# Patient Record
Sex: Female | Born: 1940 | Race: White | Hispanic: No
Health system: Southern US, Community
[De-identification: ages and names within clinical notes are randomized; demographics above are authoritative.]

## PROBLEM LIST (undated history)

## (undated) DIAGNOSIS — IMO0001 Reserved for inherently not codable concepts without codable children: Secondary | ICD-10-CM

## (undated) DIAGNOSIS — F419 Anxiety disorder, unspecified: Secondary | ICD-10-CM

## (undated) DIAGNOSIS — E0590 Thyrotoxicosis, unspecified without thyrotoxic crisis or storm: Secondary | ICD-10-CM

## (undated) DIAGNOSIS — E785 Hyperlipidemia, unspecified: Secondary | ICD-10-CM

## (undated) DIAGNOSIS — R030 Elevated blood-pressure reading, without diagnosis of hypertension: Secondary | ICD-10-CM

## (undated) DIAGNOSIS — I10 Essential (primary) hypertension: Secondary | ICD-10-CM

## (undated) DIAGNOSIS — E7800 Pure hypercholesterolemia, unspecified: Secondary | ICD-10-CM

## (undated) DIAGNOSIS — E039 Hypothyroidism, unspecified: Secondary | ICD-10-CM

## (undated) DIAGNOSIS — K219 Gastro-esophageal reflux disease without esophagitis: Secondary | ICD-10-CM

## (undated) DIAGNOSIS — F319 Bipolar disorder, unspecified: Secondary | ICD-10-CM

## (undated) HISTORY — DX: Pure hypercholesterolemia, unspecified: E78.00

## (undated) HISTORY — DX: Thyrotoxicosis, unspecified without thyrotoxic crisis or storm: E05.90

## (undated) HISTORY — DX: Hypothyroidism, unspecified: E03.9

## (undated) HISTORY — DX: Anxiety disorder, unspecified: F41.9

## (undated) HISTORY — DX: Hyperlipidemia, unspecified: E78.5

## (undated) HISTORY — DX: Essential (primary) hypertension: I10

## (undated) HISTORY — DX: Gastro-esophageal reflux disease without esophagitis: K21.9

## (undated) HISTORY — DX: Elevated blood-pressure reading, without diagnosis of hypertension: R03.0

## (undated) HISTORY — DX: Reserved for inherently not codable concepts without codable children: IMO0001

## (undated) HISTORY — PX: OTHER SURGICAL HISTORY: SHX169

## (undated) HISTORY — PX: TONSILLECTOMY: SUR1361

## (undated) HISTORY — DX: Bipolar disorder, unspecified: F31.9

---

## 1998-04-26 HISTORY — PX: OTHER SURGICAL HISTORY: SHX169

## 1998-04-28 ENCOUNTER — Ambulatory Visit (HOSPITAL_COMMUNITY): Admission: RE | Admit: 1998-04-28 | Discharge: 1998-04-28 | Payer: Self-pay | Admitting: Internal Medicine

## 1998-04-28 ENCOUNTER — Encounter: Payer: Self-pay | Admitting: Otolaryngology

## 1998-05-05 ENCOUNTER — Other Ambulatory Visit: Admission: RE | Admit: 1998-05-05 | Discharge: 1998-05-05 | Payer: Self-pay | Admitting: Otolaryngology

## 1998-09-21 ENCOUNTER — Other Ambulatory Visit: Admission: RE | Admit: 1998-09-21 | Discharge: 1998-09-21 | Payer: Self-pay | Admitting: Gynecology

## 1998-10-07 ENCOUNTER — Ambulatory Visit (HOSPITAL_COMMUNITY): Admission: RE | Admit: 1998-10-07 | Discharge: 1998-10-07 | Payer: Self-pay | Admitting: Gynecology

## 1998-10-07 ENCOUNTER — Encounter: Payer: Self-pay | Admitting: Gynecology

## 1999-01-28 ENCOUNTER — Other Ambulatory Visit: Admission: RE | Admit: 1999-01-28 | Discharge: 1999-01-28 | Payer: Self-pay | Admitting: Gynecology

## 1999-01-28 ENCOUNTER — Encounter (INDEPENDENT_AMBULATORY_CARE_PROVIDER_SITE_OTHER): Payer: Self-pay | Admitting: Specialist

## 1999-11-09 ENCOUNTER — Other Ambulatory Visit: Admission: RE | Admit: 1999-11-09 | Discharge: 1999-11-09 | Payer: Self-pay | Admitting: Gynecology

## 1999-11-23 ENCOUNTER — Encounter: Payer: Self-pay | Admitting: Gynecology

## 1999-11-23 ENCOUNTER — Encounter: Admission: RE | Admit: 1999-11-23 | Discharge: 1999-11-23 | Payer: Self-pay | Admitting: Gynecology

## 2000-03-28 ENCOUNTER — Other Ambulatory Visit: Admission: RE | Admit: 2000-03-28 | Discharge: 2000-03-28 | Payer: Self-pay | Admitting: Gynecology

## 2000-09-19 ENCOUNTER — Encounter: Admission: RE | Admit: 2000-09-19 | Discharge: 2000-09-19 | Payer: Self-pay | Admitting: Otolaryngology

## 2000-09-19 ENCOUNTER — Encounter: Payer: Self-pay | Admitting: Otolaryngology

## 2000-09-24 ENCOUNTER — Encounter: Admission: RE | Admit: 2000-09-24 | Discharge: 2000-09-24 | Payer: Self-pay | Admitting: Otolaryngology

## 2000-09-24 ENCOUNTER — Encounter: Payer: Self-pay | Admitting: Otolaryngology

## 2000-10-24 HISTORY — PX: NASAL SINUS SURGERY: SHX719

## 2000-11-12 ENCOUNTER — Other Ambulatory Visit: Admission: RE | Admit: 2000-11-12 | Discharge: 2000-11-12 | Payer: Self-pay | Admitting: Gynecology

## 2000-11-26 ENCOUNTER — Encounter: Payer: Self-pay | Admitting: Gynecology

## 2000-11-26 ENCOUNTER — Encounter: Admission: RE | Admit: 2000-11-26 | Discharge: 2000-11-26 | Payer: Self-pay | Admitting: Gynecology

## 2000-11-30 ENCOUNTER — Encounter (INDEPENDENT_AMBULATORY_CARE_PROVIDER_SITE_OTHER): Payer: Self-pay | Admitting: Specialist

## 2000-11-30 ENCOUNTER — Other Ambulatory Visit: Admission: RE | Admit: 2000-11-30 | Discharge: 2000-11-30 | Payer: Self-pay | Admitting: Gynecology

## 2001-04-05 ENCOUNTER — Encounter: Payer: Self-pay | Admitting: Internal Medicine

## 2001-04-05 ENCOUNTER — Encounter: Admission: RE | Admit: 2001-04-05 | Discharge: 2001-04-05 | Payer: Self-pay | Admitting: Internal Medicine

## 2001-06-26 DIAGNOSIS — IMO0001 Reserved for inherently not codable concepts without codable children: Secondary | ICD-10-CM

## 2001-06-26 HISTORY — DX: Reserved for inherently not codable concepts without codable children: IMO0001

## 2001-07-29 ENCOUNTER — Ambulatory Visit (HOSPITAL_COMMUNITY): Admission: RE | Admit: 2001-07-29 | Discharge: 2001-07-29 | Payer: Self-pay | Admitting: Gastroenterology

## 2001-08-23 ENCOUNTER — Encounter: Payer: Self-pay | Admitting: Internal Medicine

## 2001-08-23 ENCOUNTER — Encounter: Admission: RE | Admit: 2001-08-23 | Discharge: 2001-08-23 | Payer: Self-pay | Admitting: Internal Medicine

## 2001-12-20 ENCOUNTER — Ambulatory Visit (HOSPITAL_COMMUNITY): Admission: RE | Admit: 2001-12-20 | Discharge: 2001-12-20 | Payer: Self-pay | Admitting: Gastroenterology

## 2001-12-20 ENCOUNTER — Encounter: Payer: Self-pay | Admitting: Gastroenterology

## 2002-01-28 ENCOUNTER — Other Ambulatory Visit: Admission: RE | Admit: 2002-01-28 | Discharge: 2002-01-28 | Payer: Self-pay | Admitting: Gynecology

## 2002-05-25 ENCOUNTER — Emergency Department (HOSPITAL_COMMUNITY): Admission: EM | Admit: 2002-05-25 | Discharge: 2002-05-25 | Payer: Self-pay | Admitting: Emergency Medicine

## 2002-05-25 ENCOUNTER — Encounter: Payer: Self-pay | Admitting: Emergency Medicine

## 2003-02-03 ENCOUNTER — Other Ambulatory Visit: Admission: RE | Admit: 2003-02-03 | Discharge: 2003-02-03 | Payer: Self-pay | Admitting: Gynecology

## 2003-03-31 ENCOUNTER — Encounter: Admission: RE | Admit: 2003-03-31 | Discharge: 2003-03-31 | Payer: Self-pay | Admitting: Gastroenterology

## 2003-03-31 ENCOUNTER — Encounter: Payer: Self-pay | Admitting: Gastroenterology

## 2003-04-03 ENCOUNTER — Encounter: Payer: Self-pay | Admitting: Gastroenterology

## 2003-04-03 ENCOUNTER — Encounter: Admission: RE | Admit: 2003-04-03 | Discharge: 2003-04-03 | Payer: Self-pay | Admitting: Gastroenterology

## 2003-04-13 ENCOUNTER — Encounter: Payer: Self-pay | Admitting: Internal Medicine

## 2003-04-13 ENCOUNTER — Encounter: Admission: RE | Admit: 2003-04-13 | Discharge: 2003-04-13 | Payer: Self-pay | Admitting: Internal Medicine

## 2004-05-03 ENCOUNTER — Other Ambulatory Visit: Admission: RE | Admit: 2004-05-03 | Discharge: 2004-05-03 | Payer: Self-pay | Admitting: Gynecology

## 2004-08-22 ENCOUNTER — Ambulatory Visit: Payer: Self-pay | Admitting: Internal Medicine

## 2004-09-21 ENCOUNTER — Ambulatory Visit: Payer: Self-pay | Admitting: Internal Medicine

## 2004-09-28 ENCOUNTER — Ambulatory Visit (HOSPITAL_COMMUNITY): Admission: RE | Admit: 2004-09-28 | Discharge: 2004-09-28 | Payer: Self-pay | Admitting: Internal Medicine

## 2004-10-06 ENCOUNTER — Ambulatory Visit: Payer: Self-pay | Admitting: Internal Medicine

## 2004-11-25 ENCOUNTER — Ambulatory Visit: Payer: Self-pay | Admitting: Internal Medicine

## 2005-06-14 ENCOUNTER — Ambulatory Visit: Payer: Self-pay | Admitting: Internal Medicine

## 2005-09-28 ENCOUNTER — Other Ambulatory Visit: Admission: RE | Admit: 2005-09-28 | Discharge: 2005-09-28 | Payer: Self-pay | Admitting: Gynecology

## 2005-12-12 ENCOUNTER — Ambulatory Visit: Payer: Self-pay | Admitting: Internal Medicine

## 2006-02-23 ENCOUNTER — Ambulatory Visit: Payer: Self-pay | Admitting: Internal Medicine

## 2006-04-11 ENCOUNTER — Ambulatory Visit: Payer: Self-pay | Admitting: Internal Medicine

## 2006-08-22 ENCOUNTER — Encounter: Payer: Self-pay | Admitting: Internal Medicine

## 2006-09-07 ENCOUNTER — Ambulatory Visit: Payer: Self-pay | Admitting: Internal Medicine

## 2006-10-22 ENCOUNTER — Other Ambulatory Visit: Admission: RE | Admit: 2006-10-22 | Discharge: 2006-10-22 | Payer: Self-pay | Admitting: Gynecology

## 2006-10-29 ENCOUNTER — Encounter: Payer: Self-pay | Admitting: Internal Medicine

## 2006-10-29 LAB — CONVERTED CEMR LAB
HDL: 68 mg/dL
LDL Cholesterol: 139 mg/dL
TSH: 0.377 microintl units/mL
Triglyceride fasting, serum: 143 mg/dL

## 2006-10-31 ENCOUNTER — Encounter: Payer: Self-pay | Admitting: Internal Medicine

## 2006-11-08 ENCOUNTER — Encounter: Payer: Self-pay | Admitting: Internal Medicine

## 2006-11-27 ENCOUNTER — Encounter: Admission: RE | Admit: 2006-11-27 | Discharge: 2006-11-27 | Payer: Self-pay | Admitting: Gastroenterology

## 2006-11-27 ENCOUNTER — Encounter: Payer: Self-pay | Admitting: Internal Medicine

## 2007-01-11 ENCOUNTER — Ambulatory Visit: Payer: Self-pay | Admitting: Internal Medicine

## 2007-02-26 ENCOUNTER — Encounter: Payer: Self-pay | Admitting: Internal Medicine

## 2007-04-10 ENCOUNTER — Ambulatory Visit: Payer: Self-pay | Admitting: Internal Medicine

## 2007-04-16 ENCOUNTER — Ambulatory Visit: Payer: Self-pay | Admitting: Internal Medicine

## 2007-05-10 DIAGNOSIS — J45998 Other asthma: Secondary | ICD-10-CM | POA: Insufficient documentation

## 2007-06-06 ENCOUNTER — Ambulatory Visit: Payer: Self-pay | Admitting: Internal Medicine

## 2007-07-25 ENCOUNTER — Ambulatory Visit: Payer: Self-pay | Admitting: Internal Medicine

## 2007-10-21 ENCOUNTER — Encounter: Payer: Self-pay | Admitting: Internal Medicine

## 2007-10-24 ENCOUNTER — Other Ambulatory Visit: Admission: RE | Admit: 2007-10-24 | Discharge: 2007-10-24 | Payer: Self-pay | Admitting: Gynecology

## 2007-11-25 ENCOUNTER — Encounter: Payer: Self-pay | Admitting: Internal Medicine

## 2007-12-03 ENCOUNTER — Encounter: Payer: Self-pay | Admitting: Internal Medicine

## 2007-12-03 LAB — CONVERTED CEMR LAB
Albumin: 4.7 g/dL
Alkaline Phosphatase: 70 units/L
BUN: 12 mg/dL
Calcium: 10 mg/dL
Cholesterol: 212 mg/dL
Creatinine, Ser: 0.83 mg/dL
Sodium: 140 meq/L
Total Bilirubin: 0.4 mg/dL
Total Protein: 7.2 g/dL
Triglyceride fasting, serum: 131 mg/dL

## 2008-01-14 ENCOUNTER — Ambulatory Visit: Payer: Self-pay | Admitting: Internal Medicine

## 2008-03-25 ENCOUNTER — Ambulatory Visit: Payer: Self-pay | Admitting: Internal Medicine

## 2008-04-21 ENCOUNTER — Telehealth (INDEPENDENT_AMBULATORY_CARE_PROVIDER_SITE_OTHER): Payer: Self-pay | Admitting: *Deleted

## 2008-06-01 ENCOUNTER — Ambulatory Visit: Payer: Self-pay | Admitting: Internal Medicine

## 2008-06-03 ENCOUNTER — Ambulatory Visit: Payer: Self-pay | Admitting: Internal Medicine

## 2008-10-13 ENCOUNTER — Encounter: Payer: Self-pay | Admitting: Internal Medicine

## 2008-11-18 ENCOUNTER — Encounter: Payer: Self-pay | Admitting: Internal Medicine

## 2008-12-02 ENCOUNTER — Encounter: Payer: Self-pay | Admitting: Internal Medicine

## 2008-12-02 LAB — CONVERTED CEMR LAB: TSH: 0.33 microintl units/mL

## 2008-12-07 ENCOUNTER — Ambulatory Visit: Payer: Self-pay | Admitting: Internal Medicine

## 2009-03-05 ENCOUNTER — Encounter: Payer: Self-pay | Admitting: Internal Medicine

## 2009-03-05 LAB — CONVERTED CEMR LAB: TSH: 1.41 microintl units/mL

## 2009-04-02 ENCOUNTER — Ambulatory Visit: Payer: Self-pay | Admitting: Internal Medicine

## 2009-04-14 ENCOUNTER — Encounter (INDEPENDENT_AMBULATORY_CARE_PROVIDER_SITE_OTHER): Payer: Self-pay | Admitting: *Deleted

## 2009-06-24 ENCOUNTER — Ambulatory Visit: Payer: Self-pay | Admitting: Internal Medicine

## 2009-07-27 ENCOUNTER — Ambulatory Visit: Payer: Self-pay | Admitting: Internal Medicine

## 2009-08-05 ENCOUNTER — Encounter: Payer: Self-pay | Admitting: Internal Medicine

## 2009-09-10 ENCOUNTER — Ambulatory Visit: Payer: Self-pay | Admitting: Internal Medicine

## 2009-09-10 DIAGNOSIS — E785 Hyperlipidemia, unspecified: Secondary | ICD-10-CM | POA: Insufficient documentation

## 2009-09-10 DIAGNOSIS — M810 Age-related osteoporosis without current pathological fracture: Secondary | ICD-10-CM | POA: Insufficient documentation

## 2009-09-10 DIAGNOSIS — F319 Bipolar disorder, unspecified: Secondary | ICD-10-CM

## 2009-09-10 DIAGNOSIS — E039 Hypothyroidism, unspecified: Secondary | ICD-10-CM

## 2009-09-21 ENCOUNTER — Encounter: Payer: Self-pay | Admitting: Internal Medicine

## 2009-10-08 ENCOUNTER — Encounter: Payer: Self-pay | Admitting: Internal Medicine

## 2009-11-10 ENCOUNTER — Ambulatory Visit: Payer: Self-pay | Admitting: Internal Medicine

## 2009-11-15 ENCOUNTER — Ambulatory Visit: Payer: Self-pay | Admitting: Internal Medicine

## 2010-01-03 ENCOUNTER — Telehealth (INDEPENDENT_AMBULATORY_CARE_PROVIDER_SITE_OTHER): Payer: Self-pay | Admitting: *Deleted

## 2010-01-10 ENCOUNTER — Ambulatory Visit: Payer: Self-pay | Admitting: Internal Medicine

## 2010-03-15 ENCOUNTER — Ambulatory Visit: Payer: Self-pay | Admitting: Internal Medicine

## 2010-03-15 DIAGNOSIS — R5383 Other fatigue: Secondary | ICD-10-CM | POA: Insufficient documentation

## 2010-03-15 DIAGNOSIS — R5381 Other malaise: Secondary | ICD-10-CM | POA: Insufficient documentation

## 2010-03-16 LAB — CONVERTED CEMR LAB
ALT: 13 units/L (ref 0–35)
Alkaline Phosphatase: 67 units/L (ref 39–117)
BUN: 12 mg/dL (ref 6–23)
Basophils Absolute: 0.1 10*3/uL (ref 0.0–0.1)
Bilirubin, Direct: 0.1 mg/dL (ref 0.0–0.3)
Cholesterol: 178 mg/dL (ref 0–200)
Creatinine, Ser: 0.8 mg/dL (ref 0.4–1.2)
GFR calc non Af Amer: 74 mL/min (ref >60)
Glucose, Bld: 95 mg/dL (ref 70–99)
HCT: 38.8 % (ref 36.0–46.0)
LDL Cholesterol: 78 mg/dL (ref 0–99)
Lymphs Abs: 1.4 10*3/uL (ref 0.7–4.0)
MCV: 91 fL (ref 78.0–100.0)
Monocytes Absolute: 0.5 10*3/uL (ref 0.1–1.0)
Monocytes Relative: 5.7 % (ref 3.0–12.0)
Neutrophils Relative %: 64.7 % (ref 43.0–77.0)
Platelets: 219 10*3/uL (ref 150.0–400.0)
Potassium: 4.9 meq/L (ref 3.5–5.1)
RDW: 13.6 % (ref 11.5–14.6)
Total Bilirubin: 0.4 mg/dL (ref 0.3–1.2)
Triglycerides: 148 mg/dL (ref 0.0–149.0)

## 2010-04-13 ENCOUNTER — Ambulatory Visit: Payer: Self-pay | Admitting: Internal Medicine

## 2010-04-14 ENCOUNTER — Ambulatory Visit: Payer: Self-pay | Admitting: Internal Medicine

## 2010-04-25 ENCOUNTER — Ambulatory Visit: Payer: Self-pay | Admitting: Endocrinology

## 2010-04-25 DIAGNOSIS — L02519 Cutaneous abscess of unspecified hand: Secondary | ICD-10-CM

## 2010-04-25 DIAGNOSIS — L03119 Cellulitis of unspecified part of limb: Secondary | ICD-10-CM

## 2010-06-02 ENCOUNTER — Ambulatory Visit: Payer: Self-pay | Admitting: Cardiology

## 2010-06-15 ENCOUNTER — Encounter: Payer: Self-pay | Admitting: Internal Medicine

## 2010-06-21 ENCOUNTER — Encounter: Payer: Self-pay | Admitting: Internal Medicine

## 2010-07-12 ENCOUNTER — Encounter: Payer: Self-pay | Admitting: Internal Medicine

## 2010-07-12 ENCOUNTER — Other Ambulatory Visit: Payer: Self-pay | Admitting: Radiology

## 2010-07-14 ENCOUNTER — Ambulatory Visit: Admit: 2010-07-14 | Payer: Self-pay | Admitting: Internal Medicine

## 2010-07-14 ENCOUNTER — Other Ambulatory Visit
Admission: RE | Admit: 2010-07-14 | Discharge: 2010-07-14 | Payer: Self-pay | Source: Home / Self Care | Attending: Radiology | Admitting: Radiology

## 2010-07-26 NOTE — Assessment & Plan Note (Signed)
Summary: 4 mos f/u # cd--FLU VAC ALSO PER PT--STC   Vital Signs:  Patient profile:   70 year old female Height:      66 inches (167.64 cm) Weight:      156.12 pounds (70.96 kg) O2 Sat:      97 % on Room air Temp:     98.2 degrees F (36.78 degrees C) oral Pulse rate:   79 / minute BP sitting:   122 / 84  (left arm) Cuff size:   regular  Vitals Entered By: Orlan Leavens RMA (March 15, 2010 1:19 PM)  O2 Flow:  Room air CC: 4 month follow-up Is Patient Diabetic? No Pain Assessment Patient in pain? no      Comments want flu shot   Primary Care Provider:  Newt Lukes MD  CC:  4 month follow-up.  History of Present Illness: here for followup  1) hypothyroid - reports compliance with ongoing medical treatment and no changes in medication dose or frequency. denies adverse side effects related to current therapy.  has endo at [facility] for bones but follows thyroid with PCP - recent TSH 09/2009 nirmal -  2) dyslipidemia -reports compliance with ongoing medical treatment and no changes in medication dose or frequency. denies adverse side effects related to current therapy.   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 - follows with psyc for same including lithium levels - freq changes in dose managed by psyc - feels mood and energy stable now  4) osteoporosis - follows with [facility] for same - no bone pains - completed forteo in place of reclast - follows at [facility] every 12 months  Clinical Review Panels:  Immunizations   Last Tetanus Booster:  Historical (10/25/2006)   Last Flu Vaccine:  Fluvax 3+ (03/15/2010)   Last H1N1 Vaccine 1:  Given (06/03/2008)   Last Pneumovax:  Historical (06/26/1998)   Last Zoster Vaccine:  Zostavax (06/26/2006)  Lipid Management   Cholesterol:  212 (12/03/2007)   LDL (bad choesterol):  125 (12/03/2007)   HDL (good cholesterol):  61 (12/03/2007)   Triglycerides:  131 (12/03/2007)   Current Medications  (verified): 1)  Synthroid 75 Mcg Tabs (Levothyroxine Sodium) .Marland Kitchen.. 1 By Mouth Once Daily 2)  Lithobid 300 Mg  Tbcr (Lithium Carbonate) .Marland Kitchen.. 1 By Mouth Two Times A Day 3)  Seroquel 25 Mg  Tabs (Quetiapine Fumarate) .... 1/2 Tab Once Daily 4)  Tums 500 Mg  Chew (Calcium Carbonate Antacid) .... 3 Tabs At Bedtime 5)  Nasonex 50 Mcg/act  Susp (Mometasone Furoate) .... As Needed 6)  Flovent Hfa 44 Mcg/act  Aero (Fluticasone Propionate  Hfa) .... 2 Puffs Two Times A Day As Needed 7)  Allergy Vaccine 1:10 Go (W-E) .... Once Weekly 8)  Epipen 0.3 Mg/0.47ml Devi (Epinephrine) .... For Severe Allergic Reaction 9)  Simvastatin 20 Mg Tabs (Simvastatin) .Marland Kitchen.. 1 By Mouth Qd 10)  Alprazolam 0.25 Mg Tbdp (Alprazolam) .... 1/2-1 Tab By Mouth  Every Morning 11)  Proair Hfa 108 (90 Base) Mcg/act Aers (Albuterol Sulfate) .... 2 Puffs Four Times A Day As Needed Rescue Inhaler 12)  Vitamin D 1000 Unit Tabs (Cholecalciferol) .... Take 1 By Mouth Once Daily 13)  Aspirin 81 Mg Tabs (Aspirin) .... Take 1 Mon,wed and Fri 14)  Miralax  Powd (Polyethylene Glycol 3350) .... Use 17gm At Bedtime  Allergies (verified): 1)  ! Lidocaine  Past History:  Past Medical History: peripheral venous insufficiency Asthma  hx eosinophilic pneumonia- remote GERD  Hypothyroid  ? 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    Physical Exam  General:  alert, well-developed, well-nourished, and cooperative to examination.    Lungs:  normal respiratory effort, no intercostal retractions or use of accessory muscles; normal breath sounds bilaterally - no crackles and no wheezes.    Heart:  regular rate and rhythm, S1, S2 without  murmurs, rubs, gallops, or clicks Psych:  Oriented X3, memory intact for recent and remote, normally interactive, good eye contact, not anxious appearing, not depressed appearing, and not agitated.      Impression & Recommendations:  Problem # 1:  HYPOTHYROIDISM (ICD-244.9)  Her updated medication list for this problem includes:    Synthroid 75 Mcg Tabs (Levothyroxine sodium) .Marland Kitchen... 1 by mouth once daily  Orders: TLB-TSH (Thyroid Stimulating Hormone) (84443-TSH)  Labs Reviewed: TSH: 1.41 (03/05/2009)    Chol: 212 (12/03/2007)   HDL: 61 (12/03/2007)   LDL: 125 (12/03/2007)   TG: 131 (12/03/2007)  Problem # 2:  DYSLIPIDEMIA (ICD-272.4)  Her updated medication list for this problem includes:    Simvastatin 20 Mg Tabs (Simvastatin) .Marland Kitchen... 1 by mouth qd  Orders: TLB-Lipid Panel (80061-LIPID)  Labs Reviewed: SGOT: 19 (12/03/2007)   SGPT: 11 (12/03/2007)   HDL:61 (12/03/2007), 68 (10/29/2006)  LDL:125 (12/03/2007), 139 (10/29/2006)  Chol:212 (12/03/2007), 236 (10/29/2006)  Trig:131 (12/03/2007), 143 (10/29/2006)  Problem # 3:  FATIGUE (ICD-780.79) nonsp hx and exam - check labs now Orders: TLB-BMP (Basic Metabolic Panel-BMET) (80048-METABOL) TLB-CBC Platelet - w/Differential (85025-CBCD)  Problem # 4:  UNSPECIFIED PSYCHOSIS (ICD-298.9)  defer mgmt and meds to psyc - coddle  Problem # 5:  UNSPECIFIED OSTEOPOROSIS (ICD-733.00)  follows with [facility] annually - cont same  Complete Medication List: 1)  Synthroid 75 Mcg Tabs (Levothyroxine sodium) .Marland Kitchen.. 1 by mouth once daily 2)  Lithobid 300 Mg Tbcr (Lithium carbonate) .Marland Kitchen.. 1 by mouth two times a day 3)  Seroquel 25 Mg Tabs (Quetiapine fumarate) .... 1/2 tab once daily 4)  Tums 500 Mg Chew (Calcium carbonate antacid) .... 3 tabs at bedtime 5)  Nasonex 50 Mcg/act Susp (Mometasone furoate) .... As needed 6)  Flovent Hfa 44 Mcg/act Aero (Fluticasone propionate  hfa) .... 2 puffs two times a day as needed 7)  Allergy Vaccine 1:10 Go  (w-e)  .... Once weekly 8)  Epipen 0.3 Mg/0.43ml Devi (Epinephrine) .... For severe allergic reaction 9)  Simvastatin 20 Mg Tabs (Simvastatin) .Marland Kitchen.. 1 by mouth qd 10)  Alprazolam 0.25 Mg Tbdp (Alprazolam) .... 1/2-1 tab by mouth  every morning 11)  Proair Hfa 108 (90 Base) Mcg/act Aers (Albuterol sulfate) .... 2 puffs four times a day as needed rescue inhaler 12)  Vitamin D 1000 Unit Tabs (Cholecalciferol) .... Take 1 by mouth once daily 13)  Aspirin 81 Mg Tabs (Aspirin) .... Take 1 mon,wed and fri 14)  Miralax Powd (Polyethylene glycol 3350) .... Use 17gm at bedtime  Other Orders: Flu Vaccine 66yrs + MEDICARE PATIENTS (Z6109) Administration Flu vaccine - MCR (G0008) TLB-Hepatic/Liver Function Pnl (80076-HEPATIC) Flu Vaccine Consent Questions     Do you have a history of severe allergic reactions to this vaccine? no  Any prior history of allergic reactions to egg and/or gelatin? no    Do you have a sensitivity to the preservative Thimersol? no    Do you have a past history of Guillan-Barre Syndrome? no    Do you currently have an acute febrile illness? no    Have you ever had a severe reaction to latex? no    Vaccine information given and explained to patient? yes    Are you currently pregnant? no    Lot Number:[id number]   Exp Date:12/24/2010   Site Given  Right Deltoid IMion Flu vaccine - MCR ([id number])  Patient Instructions: 1)  it was good to see you today. 2)  test(s) ordered today - your results will be posted on the phone tree for review in 48-72 hours from the time of test completion; call [phone] and enter your 9 digit MRN (listed above on this page, just below your name); if any changes need to be made or there are abnormal results, you will be contacted directly.  3)  medications reviewed -  no changes recommended today - 4)  Please schedule a follow-up appointment in 6 monthsfor thyroid check and review, call sooner if problems.    Marland Kitchenlbmedflu

## 2010-07-26 NOTE — Miscellaneous (Signed)
[facility]   Imported By: Lester Pine Knot 09/17/2009 09:30:09  _____________________________________________________________________  External Attachment:    Type:   Image     Comment:   External Document

## 2010-07-26 NOTE — Procedures (Signed)
Summary: [facility]  [facility]   Imported By: Sherian Rein 09/24/2009 09:46:22  _____________________________________________________________________  External Attachment:    Type:   Image     Comment:   External Document

## 2010-07-26 NOTE — Assessment & Plan Note (Signed)
Summary: rov 3 months///kp   Primary Abdiel Blackerby/Referring Arrow Tomko:  Newt Lukes MD  CC:  3 month follow up visit-asthma and allergies..  History of Present Illness: July 27, 2009- Asthma, lung nodule She says her asthma and allergies have done very much better and she credits the allergy vaccine.  She has been seeing a dermatologist for pruritus without rash. She blames Forteo she is taking for her bones. She asks help establishing with a different primary physician. She used rescue inhaler for about 3 days in mid Fall.  January 10, 2010- Asthma, lung nodule She had done very well until a few weeks ago, when her asthma flared for uncertain reason. She refilled her Flovent 44, but doesn't have a rescue inhaler. Didn't recognze exposure to anyone sick or any obvious reflux. She continues allergy vaccine and needs refill Epipen, which we discussed. Seeing Dr Adline Mango for dermatology- sun exposure problems.  April 14, 2010- Asthma, hx lung nodule, rhinitis nurse CC: 3 month follow up visit-asthma and allergies. Began wheezing this summer, after years with none. Has been controlled now using Flovent 44. Hasn't needed the Proair at all.  Rhinitis also has become more active and she has resumed Nasonex.  Notes BP unusually high today.  She had eosinophilic pneumonia years ago. We have not seen a recurrence.   Asthma History    Asthma Control Assessment:    Age range: 12+ years    Symptoms: 0-2 days/week    Nighttime Awakenings: 0-2/month    Interferes w/ normal activity: no limitations    SABA use (not for EIB): 0-2 days/week    Asthma Control Assessment: Well Controlled   Preventive Screening-Counseling & Management  Alcohol-Tobacco     Smoking Status: never     Passive Smoke Exposure: yes  Current Medications (verified): 1)  Synthroid 75 Mcg Tabs (Levothyroxine Sodium) .Marland Kitchen.. 1 By Mouth Once Daily 2)  Lithobid 300 Mg  Tbcr (Lithium Carbonate) .Marland Kitchen.. 1 By Mouth Two Times A  Day 3)  Seroquel 25 Mg  Tabs (Quetiapine Fumarate) .... 1/2 Tab Once Daily 4)  Tums 500 Mg  Chew (Calcium Carbonate Antacid) .... 3 Tabs At Bedtime 5)  Nasonex 50 Mcg/act  Susp (Mometasone Furoate) .... As Needed 6)  Flovent Hfa 44 Mcg/act  Aero (Fluticasone Propionate  Hfa) .... 2 Puffs Two Times A Day As Needed 7)  Allergy Vaccine 1:10 Go (W-E) .... Once Weekly 8)  Epipen 0.3 Mg/0.44ml Devi (Epinephrine) .... For Severe Allergic Reaction 9)  Simvastatin 20 Mg Tabs (Simvastatin) .Marland Kitchen.. 1 By Mouth Qd 10)  Alprazolam 0.25 Mg Tbdp (Alprazolam) .... 1/2-1 Tab By Mouth  Every Morning 11)  Proair Hfa 108 (90 Base) Mcg/act Aers (Albuterol Sulfate) .... 2 Puffs Four Times A Day As Needed Rescue Inhaler 12)  Vitamin D 1000 Unit Tabs (Cholecalciferol) .... Take 1 By Mouth Once Daily 13)  Aspirin 81 Mg Tabs (Aspirin) .... Take 1 Mon,wed and Fri 14)  Miralax  Powd (Polyethylene Glycol 3350) .... Use 17gm At Bedtime  Allergies (verified): 1)  ! Lidocaine 2)  ! * Pna Vaccine  Past History:  Past Medical History: Last updated: 03/15/2010 peripheral venous insufficiency Asthma hx eosinophilic pneumonia- remote GERD  Hypothyroid  ? 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 Haroldine Laws Right achilles tendon surgery, dr. Zachery Dakins @ [facility] sinus surgery right frontal sinus, mucocle, dr. Sherrie Mustache @ [facility] 10/2000  Family History: Last updated: October 04, 2009 Father- died age 72- dementia Mother- died CVA @ 51 Several maternal aunts with breast cancer  Social History: Last updated: 07/27/2009 Patient never smoked.  Positive history of passive tobacco smoke exposure-  hx married  Risk Factors: Smoking Status: never (04/14/2010) Passive Smoke Exposure: yes (04/14/2010)  Review of Systems      See HPI  The patient denies shortness of breath with activity, shortness of breath at rest, productive cough, non-productive cough, coughing up blood, chest pain, irregular heartbeats, acid heartburn, indigestion, loss of appetite, weight change, abdominal pain, difficulty swallowing, sore throat, tooth/dental problems, headaches, nasal congestion/difficulty breathing through nose, and sneezing.    Vital Signs:  Patient profile:   70 year old female Height:      66 inches Weight:      154.25 pounds BMI:     24.99 O2 Sat:      98 % on Room air Pulse rate:   88 / minute BP sitting:   150 / 98  (left arm) Cuff size:   regular  Vitals Entered By: Reynaldo Minium CMA (April 14, 2010 10:54 AM)  O2 Flow:  Room air CC: 3 month follow up visit-asthma and allergies.   Physical Exam  Additional Exam:  General: A/Ox3; pleasant and cooperative, NAD, SKIN: no rash, lesions NODES: no lymphadenopathy HEENT: [place]/AT, EOM- WNL, Conjuctivae- clear, PERRLA, TM-WNL, Nose- clear, Throat- clear and wnl, Mallampati  II, not red. NECK: Supple w/ fair ROM, JVD- none, normal carotid impulses w/o bruits Thyroid-  CHEST: Clear to P&A, unlabored without cough or wheeze HEART: RRR, no m/g/r heard ABDOMEN: Soft and nl;  EAV:WUJW, nl pulses, no edema , NEURO: Grossly intact to observation      Impression & Recommendations:  Problem # 1:  ASTHMA (ICD-493.90) She describes abrupt worsening this summer as if it might have come from a reflux event at her doctor's office while at [facility] this summer. We will see if Flovent is enough to maintain her.  We have discussed environmental triggers and reflux precautions for her attention.   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   Other Orders: Est.  Patient Level IV (11914)  Patient Instructions: 1)  Please schedule a follow-up appointment in 3 months 2)  If you feel well, try reducing the Flovent to once daily for a month. If you still remain stable and don't feel tight or feel that you need your rescue inhaler Proair more than a couple of times a week, then it would be ok to try off Flovent.  3)  Call if you need me.Marland Kitchen

## 2010-07-26 NOTE — Assessment & Plan Note (Signed)
Summary: WRIST INJURY/ VAL'S PT / NWS   Vital Signs:  Patient profile:   70 year old female Height:      66 inches (167.64 cm) Weight:      155 pounds (70.45 kg) BMI:     25.11 O2 Sat:      94 % on Room air Temp:     97.8 degrees F (36.56 degrees C) oral Pulse rate:   68 / minute BP sitting:   120 / 78  (left arm) Cuff size:   regular  Vitals Entered By: Brenton Grills CMA Duncan Dull) (April 25, 2010 10:50 AM)  O2 Flow:  Room air CC: Cut on Left Wrist with soreness/aj Is Patient Diabetic? No   Primary Provider:  Newt Lukes MD  CC:  Cut on Left Wrist with soreness/aj.  History of Present Illness: pt accidentally scraped her left wrist with jewelry.  since then, she has 1 week of pain there, but no assoc itching.  Current Medications (verified): 1)  Synthroid 75 Mcg Tabs (Levothyroxine Sodium) .Marland Kitchen.. 1 By Mouth Once Daily 2)  Lithobid 300 Mg  Tbcr (Lithium Carbonate) .Marland Kitchen.. 1 By Mouth Two Times A Day 3)  Seroquel 25 Mg  Tabs (Quetiapine Fumarate) .... 1/2 Tab Once Daily 4)  Tums 500 Mg  Chew (Calcium Carbonate Antacid) .... 3 Tabs At Bedtime 5)  Nasonex 50 Mcg/act  Susp (Mometasone Furoate) .... As Needed 6)  Flovent Hfa 44 Mcg/act  Aero (Fluticasone Propionate  Hfa) .... 2 Puffs Two Times A Day As Needed 7)  Allergy Vaccine 1:10 Go (W-E) .... Once Weekly 8)  Epipen 0.3 Mg/0.72ml Devi (Epinephrine) .... For Severe Allergic Reaction 9)  Simvastatin 20 Mg Tabs (Simvastatin) .Marland Kitchen.. 1 By Mouth Qd 10)  Alprazolam 0.25 Mg Tbdp (Alprazolam) .... 1/2-1 Tab By Mouth  Every Morning 11)  Proair Hfa 108 (90 Base) Mcg/act Aers (Albuterol Sulfate) .... 2 Puffs Four Times A Day As Needed Rescue Inhaler 12)  Vitamin D 1000 Unit Tabs (Cholecalciferol) .... Take 1 By Mouth Once Daily 13)  Aspirin 81 Mg Tabs (Aspirin) .... Take 1 Mon,wed and Fri 14)  Miralax  Powd (Polyethylene Glycol 3350) .... Use 17gm At Bedtime  Allergies (verified): 1)  ! Lidocaine 2)  ! * Pna Vaccine  Past  History:  Past Medical History: Last updated: 03/15/2010 peripheral venous insufficiency Asthma hx eosinophilic pneumonia- remote GERD  Hypothyroid  ? 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    Physical Exam  General:  normal appearance.   Skin:  left forearm, just proximal to the wrist:  ther is 1 cm shallow ilcer, and 3 cm surrounding erythema.   Impression & Recommendations:  Problem # 1:  CELLULITIS&ABSCESS OF HAND EXCEPT FINGERS&THUMB (ICD-682.4) Assessment New mild  Medications Added to Medication List This Visit: 1)  Doxycycline Hyclate 100 Mg Caps (Doxycycline hyclate) .Marland Kitchen.. 1 tab two times a day  Other Orders: Est. Patient Level III (11914)  Patient Instructions: 1)  take doxycycline two times a day, x 1 week 2)  also. you should keep the area covered with antibiotic ointment, and a bandaid. 3)  call if this does not help.   Prescriptions: DOXYCYCLINE HYCLATE 100 MG CAPS (DOXYCYCLINE HYCLATE) 1 tab two  times a day  #14 x 0   Entered and Authorized by:   Minus Breeding MD   Signed by:   Minus Breeding MD on 04/25/2010   Method used:   Electronically to        [facility] [address] [phone]* (retail)       [address]       [place], [place]  [address]       Ph: [phone] or [phone]       Fax: [phone]   RxID:   [id number]    Orders Added: 1)  Est. Patient Level III [15176]

## 2010-07-26 NOTE — Assessment & Plan Note (Signed)
Summary: ROV/ MBW   Primary Provider/Referring Provider:  Kirby Funk  CC:  Yearly follow up visit-"breathing doing fine"..  History of Present Illness:  12/08-Asthma, Lung nodule History of Present Illness: If uses Nasonex and Flovent for stretches as needed she heads off asthma spell. One flare this summer when she ran out. No routine wheeze/ cough This is a 70 year old woman who presents for follow-up Pulmonary visit.  The patient has no history of fever, heartburn, and cough.   The patient comes in today for follow-up of asthma.   The patient has no history of chest pain, shortness of breath, and shortness of breath with exertion.  The patient comes in today for follow-up of asthma.    Since the last visit, the patient feels their symptoms are unchanged and do not impair daily activities.  The patient notes good compliance with treatment plan.    06/03/08-Asthma, lung nodule Very good year. Not sure why- she has been consistent with her allergy shots. Had seasonal flu vax. Meds and flu season discussed.  July 27, 2009- Asthma, lung nodule She says her asthma and allergies have done very much better and she credits the allergy vaccine.  She has been seeing a dermatologist for pruritus without rash. She blames Forteo she is taking for her bones. She asks help establishing with a different primary physician. She used rescue inhaler for about 3 days in mid Fall.    Current Medications (verified): 1)  Synthroid 88 Mcg  Tabs (Levothyroxine Sodium) .... Take 1 Tablet By Mouth Once A Day 2)  Lithobid 300 Mg  Tbcr (Lithium Carbonate) .... 2 Tabs At Bedtime 3)  Seroquel 25 Mg  Tabs (Quetiapine Fumarate) .... 1/2 Tab Once Daily 4)  Tums 500 Mg  Chew (Calcium Carbonate Antacid) .... 2 Tabs At Bedtime 5)  Forteo 600 Mcg/2.22ml Soln (Teriparatide (Recombinant)) .... Once Daily 6)  Nasonex 50 Mcg/act  Susp (Mometasone Furoate) .... As Needed 7)  Flovent Hfa 44 Mcg/act  Aero (Fluticasone  Propionate  Hfa) .... 2 Puffs Two Times A Day As Needed 8)  Allergy Vaccine 1:10 Go (W-E) .... Once Weekly 9)  Epipen 2-Pak 0.3 Mg/0.1ml (1:1000)  Devi (Epinephrine Hcl (Anaphylaxis)) .... For Severe Allergic Reaction  Allergies (verified): 1)  ! Lidocaine  Past History:  Past Surgical History: Last updated: 06/03/2008 Tonsillectomy Foot and ankle R  Social History: Last updated: 07/27/2009 Patient never smoked.  Positive history of passive tobacco smoke exposure- hx married  Risk Factors: Smoking Status: never (06/03/2008) Passive Smoke Exposure: yes (06/03/2008)  Past Medical History: peripheral venous insufficiency Asthma hx eosinophilic pneumonia- remote GERD Hypothyroid  ? Bipolar- hx  Family History: Father- died age 87- dementia Mother- died CVA  Social History: Patient never smoked.  Positive history of passive tobacco smoke exposure- hx married  Review of Systems      See HPI  The patient denies anorexia, fever, weight loss, weight gain, vision loss, decreased hearing, hoarseness, chest pain, syncope, dyspnea on exertion, peripheral edema, prolonged cough, headaches, hemoptysis, abdominal pain, and severe indigestion/heartburn.    Vital Signs:  Patient profile:   70 year old female Height:      66 inches Weight:      156 pounds BMI:     25.27 O2 Sat:      100 % on Room air Pulse rate:   90 / minute BP sitting:   124 / 78  (left arm) Cuff size:   regular  Vitals Entered By:  Reynaldo Minium CMA (July 27, 2009 2:14 PM)  O2 Flow:  Room air  Physical Exam  Additional Exam:  General: A/Ox3; pleasant and cooperative, NAD, SKIN: no rash, lesions NODES: no lymphadenopathy HEENT: [place]/AT, EOM- WNL, Conjuctivae- clear, PERRLA, TM-WNL, Nose- clear, Throat- clear and wnl NECK: Supple w/ fair ROM, JVD- none, normal carotid impulses w/o bruits Thyroid-  CHEST: Clear to P&A, unlabored without cough or wheeze HEART: RRR, no m/g/r heard ABDOMEN: Soft and  nl;  ZOX:WRUE, nl pulses, no edema , elastic hose NEURO: Grossly intact to observation      Impression & Recommendations:  Problem # 1:  ASTHMA (ICD-493.90) Excellent control. She continues to do well with allergy vaccine and some care about environmental exposures.  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  Other Orders: Est. Patient Level III (45409) Primary Care Referral (Primary)  Patient Instructions: 1)  Please schedule a follow-up appointment in 6 months. 2)  Continue allergy vaccine 3)  See [facility] about any help available locating a new primary MD

## 2010-07-26 NOTE — Letter (Signed)
Summary: [facility]   [facility]   Imported By: Sherian Rein 09/24/2009 09:50:21  _____________________________________________________________________  External Attachment:    Type:   Image     Comment:   External Document

## 2010-07-26 NOTE — Assessment & Plan Note (Signed)
Summary: NEW/ MEDICARE/ NWS #   Vital Signs:  Patient profile:   70 year old female Height:      66 inches Weight:      155.50 pounds BMI:     25.19 O2 Sat:      97 % on Room air Temp:     97.4 degrees F oral Pulse rate:   89 / minute BP sitting:   120 / 84  (left arm)  Vitals Entered By: Lucious Groves (September 10, 2009 2:04 PM)  O2 Flow:  Room air CC: NP-est care-Pt is here to meet with MD and discuss/est care. Pt states that there are no complaints because she has issues all the time./kb Is Patient Diabetic? No Pain Assessment Patient in pain? no        Primary Care Provider:  Newt Lukes MD  CC:  NP-est care-Pt is here to meet with MD and discuss/est care. Pt states that there are no complaints because she has issues all the time./kb.  History of Present Illness: new pt to me and PC division - here to est care prev followed with dr. Jonny Ruiz griffin at [facility]  1) hypothyroid - reports compliance with ongoing medical treatment and no changes in medication dose or frequency. denies adverse side effects related to current therapy.  has endo at [facility] for bones but follows thyroid with PCP  2) dyslipidemia -reports compliance with ongoing medical treatment and no changes in medication dose or frequency. denies adverse side effects related to current therapy.   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 Take 1 Tablet By Mouth Once A Day 2)  Lithobid 300 Mg  Tbcr (Lithium Carbonate) .... 2 Tabs At Bedtime 3)  Seroquel 25 Mg  Tabs (Quetiapine Fumarate) .... 1/2 Tab Once  Daily 4)  Tums 500 Mg  Chew (Calcium Carbonate Antacid) .... 2 Tabs At Bedtime 5)  Forteo 600 Mcg/2.16ml Soln (Teriparatide (Recombinant)) .... Once Daily 6)  Nasonex 50 Mcg/act  Susp (Mometasone Furoate) .... As Needed 7)  Flovent Hfa 44 Mcg/act  Aero (Fluticasone Propionate  Hfa) .... 2 Puffs Two Times A Day As Needed 8)  Allergy Vaccine 1:10 Go (W-E) .... Once Weekly 9)  Epipen 2-Pak 0.3 Mg/0.61ml (1:1000)  Devi (Epinephrine Hcl (Anaphylaxis)) .... For Severe Allergic Reaction 10)  Simvastatin 20 Mg Tabs (Simvastatin) .Marland Kitchen.. 1 By Mouth Qd  Allergies (verified): 1)  ! Lidocaine  Past History:  Past Medical History: peripheral venous insufficiency Asthma hx eosinophilic pneumonia- remote GERD Hypothyroid  ? 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    Physical Exam  General:  alert, well-developed, well-nourished, and cooperative to examination.   spouse at side Lungs:  normal respiratory effort, no intercostal retractions or use of  accessory muscles; normal breath sounds bilaterally - no crackles and no wheezes.    Heart:  regular rate and rhythm, S1, S2 without murmurs, rubs, gallops, or clicks Psych:  Oriented X3, memory intact for recent and remote, normally interactive, good eye contact, not anxious appearing, not depressed appearing, and not agitated.      Impression & Recommendations:  Problem # 1:  HYPOTHYROIDISM (ICD-244.9) send for records -  plan to check labs and adjust meds as needed next OV, sooner if symptoms or probs Her updated medication list for this problem includes:    Synthroid 75 Mcg Tabs (Levothyroxine sodium) .Marland Kitchen... 1 by  mouth once daily  Problem # 2:  DYSLIPIDEMIA (ICD-272.4) again send for records - recheck LFTs and FLP next OV, sooner if probs Her updated medication list for this problem includes:    Simvastatin 20 Mg Tabs (Simvastatin) .Marland Kitchen... 1 by mouth qd  Problem # 3:  UNSPECIFIED PSYCHOSIS (ICD-298.9) defer mgmt and meds to psyc - coddle  Problem # 4:  ASTHMA (ICD-493.90) cont mgmt per dr. Maple Hudson Her updated medication list for this problem includes:    Flovent Hfa 44 Mcg/act Aero (Fluticasone propionate  hfa) .Marland Kitchen... 2 puffs two times a day as needed  Excellent control. She continues to do well with allergy vaccine and some care about environmental exposures.  Problem # 5:  UNSPECIFIED OSTEOPOROSIS (ICD-733.00) follows with [facility] - cont same Her updated medication list for this problem includes:    Forteo 600 Mcg/2.93ml Soln (Teriparatide (recombinant)) ..... Once daily  Complete Medication List: 1)  Synthroid 75 Mcg Tabs (Levothyroxine sodium) .Marland Kitchen.. 1 by mouth once daily 2)  Lithobid 300 Mg Tbcr (Lithium carbonate) .... 2 tabs at bedtime 3)  Seroquel 25 Mg Tabs (Quetiapine fumarate) .... 1/2 tab once daily 4)  Tums 500 Mg Chew (Calcium carbonate antacid) .... 2 tabs at bedtime 5)  Forteo 600 Mcg/2.78ml Soln (Teriparatide (recombinant)) .... Once daily 6)  Nasonex 50 Mcg/act Susp (Mometasone furoate) .... As needed 7)  Flovent Hfa 44 Mcg/act Aero (Fluticasone propionate  hfa) .... 2 puffs two times a day as needed 8)  Allergy Vaccine 1:10 Go (w-e)  .... Once weekly 9)  Epipen 2-pak 0.3 Mg/0.64ml (1:1000) Devi (Epinephrine hcl (anaphylaxis)) .... For severe allergic reaction 10)  Simvastatin 20 Mg Tabs (Simvastatin) .Marland Kitchen.. 1 by mouth qd 11)  Alprazolam 0.25 Mg Tbdp (Alprazolam) .... 1/2-1 tab by mouth  every morning  Patient Instructions: 1)  it was good to see you today. 2)  medications and problems reviewed today including specialsist providers - 3)  will send for records from Dr. Jone Baseman  office to review - 4)  Please schedule a follow-up appointment in 2-3 months to repeats labs and review medications, sooner if problems.

## 2010-07-26 NOTE — Letter (Signed)
Summary: [facility]  [facility]   Imported By: Sherian Rein 09/24/2009 09:39:37  _____________________________________________________________________  External Attachment:    Type:   Image     Comment:   External Document

## 2010-07-26 NOTE — Assessment & Plan Note (Signed)
Summary: [name]   Primary Provider/Referring Provider:  Newt Lukes MD  CC:  Follow up visit-asthma; having attacks for 2 weeks ; using inhaler..  History of Present Illness:  06/03/08-Asthma, lung nodule Very good year. Not sure why- she has been consistent with her allergy shots. Had seasonal flu vax. Meds and flu season discussed.  July 27, 2009- Asthma, lung nodule She says her asthma and allergies have done very much better and she credits the allergy vaccine.  She has been seeing a dermatologist for pruritus without rash. She blames Forteo she is taking for her bones. She asks help establishing with a different primary physician. She used rescue inhaler for about 3 days in mid Fall.  January 10, 2010- Asthma, lung nodule She had done very well until a few weeks ago, when her asthma flared for uncertain reason. She refilled her Flovent 44, but doesn't have a rescue inhaler. Didn't recognze exposure to anyone sick or any obvious reflux. She continues allergy vaccine and needs refill Epipen, which we discussed. Seeing Dr Adline Mango for dermatology- sun exposure problems.     Asthma History    Initial Asthma Severity Rating:    Age range: 12+ years    Symptoms: 0-2 days/week    Nighttime Awakenings: 0-2/month    Interferes w/ normal activity: no limitations    SABA use (not for EIB): 0-2 days/week    Asthma Severity Assessment: Intermittent   Preventive Screening-Counseling & Management  Alcohol-Tobacco     Smoking Status: never  Current Medications (verified): 1)  Synthroid 75 Mcg Tabs (Levothyroxine Sodium) .Marland Kitchen.. 1 By Mouth Once Daily 2)  Lithobid 300 Mg  Tbcr (Lithium Carbonate) .Marland Kitchen.. 1 By Mouth Two Times A Day 3)  Seroquel 25 Mg  Tabs (Quetiapine Fumarate) .... 1/2 Tab Once Daily 4)  Tums 500 Mg  Chew (Calcium Carbonate Antacid) .... 3 Tabs At Bedtime 5)  Nasonex 50 Mcg/act  Susp (Mometasone Furoate) .... As Needed 6)  Flovent Hfa 44 Mcg/act  Aero (Fluticasone  Propionate  Hfa) .... 2 Puffs Two Times A Day As Needed 7)  Allergy Vaccine 1:10 Go (W-E) .... Once Weekly 8)  Epipen 2-Pak 0.3 Mg/0.5ml (1:1000)  Devi (Epinephrine Hcl (Anaphylaxis)) .... For Severe Allergic Reaction 9)  Simvastatin 20 Mg Tabs (Simvastatin) .Marland Kitchen.. 1 By Mouth Qd 10)  Alprazolam 0.25 Mg Tbdp (Alprazolam) .... 1/2-1 Tab By Mouth  Every Morning  Allergies (verified): 1)  ! Lidocaine  Past History:  Past Medical History: Last updated: 11/10/2009 peripheral venous insufficiency Asthma hx eosinophilic pneumonia- remote GERD  Hypothyroid  ? 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 Haroldine Laws Right achilles tendon surgery, dr. Zachery Dakins @ [facility] sinus surgery right frontal sinus, mucocle, dr. Sherrie Mustache @ [facility] 10/2000  Family History: Last updated: September 29, 2009 Father- died age 42- dementia Mother- died CVA @ 6 Several maternal aunts with breast cancer  Social History: Last updated: 07/27/2009 Patient never smoked.  Positive history of passive tobacco smoke exposure- hx married  Risk Factors: Smoking Status: never (01/10/2010) Passive Smoke Exposure: yes (06/03/2008)  Review of Systems      See HPI  The patient denies shortness of breath with activity,  shortness of breath at rest, productive cough, non-productive cough, coughing up blood, chest pain, irregular heartbeats, acid heartburn, indigestion, loss of appetite, weight change, abdominal pain, difficulty swallowing, sore throat, tooth/dental problems, headaches, nasal congestion/difficulty breathing through nose, and sneezing.    Vital Signs:  Patient profile:   70 year old  female Height:      66 inches Weight:      156.13 pounds BMI:     25.29 O2 Sat:      92 % on Room air Pulse rate:   81 / minute BP sitting:   122 / 84  (left arm) Cuff size:   regular  Vitals Entered By: Reynaldo Minium CMA (January 10, 2010 9:54 AM)  O2 Flow:  Room air CC: Follow up visit-asthma; having attacks for 2 weeks ; using inhaler.   Physical Exam  Additional Exam:  General: A/Ox3; pleasant and cooperative, NAD, SKIN: no rash, lesions NODES: no lymphadenopathy HEENT: [place]/AT, EOM- WNL, Conjuctivae- clear, PERRLA, TM-WNL, Nose- clear, Throat- clear and wnl, Mallampati  II, not red. NECK: Supple w/ fair ROM, JVD- none, normal carotid impulses w/o bruits Thyroid-  CHEST: Clear to P&A, unlabored without cough or wheeze HEART: RRR, no m/g/r heard ABDOMEN: Soft and nl;  AVW:UJWJ, nl pulses, no edema , NEURO: Grossly intact to observation      Impression & Recommendations:  Problem # 1:  ASTHMA (ICD-493.90) This is the first exacerbation in 3 years. I suspect either poor air qulaity or possibly an aspiration event. We will make sure she has a rescue albuterol inhaler and I have discussed use. We are refilling her Epiepen with discussion about her allergy vaccine.  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  Medications Added to Medication List This Visit: 1)  Lithobid 300 Mg Tbcr (Lithium carbonate) .Marland Kitchen.. 1 by mouth two times a day 2)  Tums 500 Mg Chew (Calcium carbonate antacid) .... 3 tabs at bedtime 3)  Epipen 0.3 Mg/0.53ml Devi (Epinephrine) .... For severe allergic reaction 4)  Proair Hfa 108 (90 Base) Mcg/act Aers (Albuterol sulfate) .... 2 puffs four times a day as needed rescue inhaler  Other Orders: Est. Patient Level IV (19147)  Patient Instructions: 1)  Please schedule a follow-up appointment in 3 months. 2)  Script for [employer] rescue  inhaler to use if you get actively tight and wheezey. 3)  Refilled Epipen. Prescriptions: EPIPEN 0.3 MG/0.3ML DEVI (EPINEPHRINE) For severe allergic reaction  #1 x prn   Entered and Authorized by:   Waymon Budge MD   Signed by:   Waymon Budge MD on 01/10/2010   Method used:   Print then Give to Patient   RxID:   [id number] PROAIR HFA 108 (90 BASE) MCG/ACT AERS (ALBUTEROL SULFATE) 2 puffs four times a day as needed rescue inhaler  #1 x prn   Entered and Authorized by:   Waymon Budge MD   Signed by:   Waymon Budge MD on 01/10/2010   Method used:   Print then Give to Patient   RxID:   [id number]

## 2010-07-26 NOTE — Letter (Signed)
Summary: [facility]  [facility]   Imported By: Sherian Rein 09/24/2009 09:38:35  _____________________________________________________________________  External Attachment:    Type:   Image     Comment:   External Document

## 2010-07-26 NOTE — Assessment & Plan Note (Signed)
Summary: 2-3 MO ROV /NWS  #   Vital Signs:  Patient profile:   70 year old female Height:      66 inches (167.64 cm) Weight:      153.0 pounds (69.55 kg) O2 Sat:      94 % on Room air Temp:     98.2 degrees F (36.78 degrees C) oral Pulse rate:   87 / minute BP sitting:   122 / 86  (left arm) Cuff size:   regular  Vitals Entered By: Orlan Leavens (Nov 10, 2009 1:57 PM)  O2 Flow:  Room air CC: 2 month follow-up Is Patient Diabetic? No Pain Assessment Patient in pain? no        Primary Care Provider:  Newt Lukes MD  CC:  2 month follow-up.  History of Present Illness: here for followup  1) hypothyroid - reports compliance with ongoing medical treatment and no changes in medication dose or frequency. denies adverse side effects related to current therapy.  has endo at [facility] for bones but follows thyroid with PCP - recent TSH 09/2009 nirmal -  2) dyslipidemia -reports compliance with ongoing medical treatment and no changes in medication dose or frequency. denies adverse side effects related to current therapy.   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 - follows with psyc for same including lithium levels - freq changes in dose managed by psyc - feels mood and energy stable now  4) osteoporosis - follows [facility] for [facility] - no bone pains - changing forteo to reclast later this year  Clinical Review Panels:  Lipid Management   Cholesterol:  212 (12/03/2007)   LDL (bad choesterol):  125 (12/03/2007)   HDL (good cholesterol):  61 (12/03/2007)   Triglycerides:  131 (12/03/2007)  Complete Metabolic Panel   Glucose:  77 (12/03/2007)   Sodium:  140 (12/03/2007)   Potassium:  5.0 (12/03/2007)   Chloride:  103 (12/03/2007)   CO2:  23 (12/03/2007)   BUN:  12 (12/03/2007)   Creatinine:  0.83 (12/03/2007)   Albumin:  4.7 (12/03/2007)   Total Protein:  7.2 (12/03/2007)   Calcium:  10.0 (12/03/2007)   Total Bili:  0.4  (12/03/2007)   Alk Phos:  70 (12/03/2007)   SGPT (ALT):  11 (12/03/2007)   SGOT (AST):  19 (12/03/2007)   Current Medications (verified): 1)  Synthroid 75 Mcg Tabs (Levothyroxine Sodium) .Marland Kitchen.. 1 By Mouth Once Daily 2)  Lithobid 300 Mg  Tbcr (Lithium Carbonate) .... 2 Tabs At Bedtime 3)  Seroquel 25 Mg  Tabs (Quetiapine Fumarate) .... 1/2 Tab Once Daily 4)  Tums 500 Mg  Chew (Calcium Carbonate Antacid) .... 2 Tabs At Bedtime 5)  Forteo 600 Mcg/2.13ml Soln (Teriparatide (Recombinant)) .... Once Daily 6)  Nasonex 50 Mcg/act  Susp (Mometasone Furoate) .... As Needed 7)  Flovent Hfa 44 Mcg/act  Aero (Fluticasone Propionate  Hfa) .... 2 Puffs Two Times A Day As Needed 8)  Allergy Vaccine 1:10 Go (W-E) .... Once Weekly 9)  Epipen 2-Pak 0.3 Mg/0.73ml (1:1000)  Devi (Epinephrine Hcl (Anaphylaxis)) .... For Severe Allergic Reaction 10)  Simvastatin 20 Mg Tabs (Simvastatin) .Marland Kitchen.. 1 By Mouth Qd 11)  Alprazolam 0.25 Mg Tbdp (Alprazolam) .... 1/2-1 Tab By Mouth  Every Morning  Allergies (verified): 1)  ! Lidocaine  Past History:  Past Medical History: peripheral venous insufficiency Asthma hx eosinophilic pneumonia- remote GERD  Hypothyroid  ? Bipolar vs other steroid induced  psychosis hx   dyslipidemia  Md rooster [facility] - [facility] ([facility]) -bones/osteopo only optho - [facility] [employer] [facility]) gyn - messer  pulm - young derm -gould psyc - cottle cards - [facility] -buccini  Review of Systems  The patient denies fever, weight loss, chest pain, and headaches.    Physical Exam  General:  alert, well-developed, well-nourished, and cooperative to examination.    Lungs:  normal respiratory effort, no intercostal retractions or use of accessory muscles; normal breath sounds bilaterally - no crackles and no wheezes.    Heart:  regular rate and rhythm, S1, S2 without murmurs, rubs, gallops, or clicks Psych:  Oriented X3, memory intact for recent and remote, normally interactive, good  eye contact, not anxious appearing, not depressed appearing, and not agitated.      Impression & Recommendations:  Problem # 1:  HYPOTHYROIDISM (ICD-244.9)  Her updated medication list for this problem includes:    Synthroid 75 Mcg Tabs (Levothyroxine sodium) .Marland Kitchen... 1 by mouth once daily  Labs Reviewed: TSH: 1.41 (03/05/2009)   0.72 (09/30/2009) at [facility]  Chol: 212 (12/03/2007)   HDL: 61 (12/03/2007)   LDL: 125 (12/03/2007)   TG: 131 (12/03/2007)  Problem # 2:  DYSLIPIDEMIA (ICD-272.4)  Her updated medication list for this problem includes:    Simvastatin 20 Mg Tabs (Simvastatin) .Marland Kitchen... 1 by mouth qd  Labs Reviewed: SGOT: 19 (12/03/2007)   SGPT: 11 (12/03/2007)   HDL:61 (12/03/2007), 68 (10/29/2006)  LDL:125 (12/03/2007), 139 (10/29/2006)  Chol:212 (12/03/2007), 236 (10/29/2006)  Trig:131 (12/03/2007), 143 (10/29/2006)  Problem # 3:  UNSPECIFIED OSTEOPOROSIS (ICD-733.00)  Her updated medication list for this problem includes:    Forteo 600 Mcg/2.45ml Soln (Teriparatide (recombinant)) ..... Once daily  follows with [facility] - cont same  Problem # 4:  UNSPECIFIED PSYCHOSIS (ICD-298.9)  defer mgmt and meds to psyc - coddle  Complete Medication List: 1)  Synthroid 75 Mcg Tabs (Levothyroxine sodium) .Marland Kitchen.. 1 by mouth once daily 2)  Lithobid 300 Mg Tbcr (Lithium carbonate) .... 2 tabs at bedtime 3)  Seroquel 25 Mg Tabs (Quetiapine fumarate) .... 1/2 tab once daily 4)  Tums 500 Mg Chew (Calcium carbonate antacid) .... 2 tabs at bedtime 5)  Forteo 600 Mcg/2.73ml Soln (Teriparatide (recombinant)) .... Once daily 6)  Nasonex 50 Mcg/act Susp (Mometasone furoate) .... As needed 7)  Flovent Hfa 44 Mcg/act Aero (Fluticasone propionate  hfa) .... 2 puffs two times a day as needed 8)  Allergy Vaccine 1:10 Go (w-e)  .... Once weekly 9)  Epipen 2-pak 0.3 Mg/0.87ml (1:1000) Devi (Epinephrine hcl (anaphylaxis)) .... For severe allergic reaction 10)  Simvastatin 20 Mg Tabs (Simvastatin) .Marland Kitchen.. 1 by  mouth qd 11)  Alprazolam 0.25 Mg Tbdp (Alprazolam) .... 1/2-1 tab by mouth  every morning  Patient Instructions: 1)  it was good to see you today.  2)  medications and labs reviewed - no changes recommended 3)  Please schedule a follow-up appointment in 3-4 months, sooner if problems.

## 2010-07-26 NOTE — Procedures (Signed)
Summary: [facility]  [facility]   Imported By: Sherian Rein 09/24/2009 09:42:20  _____________________________________________________________________  External Attachment:    Type:   Image     Comment:   External Document

## 2010-07-26 NOTE — Progress Notes (Signed)
Summary: prescript  Phone Note Call from Patient   Caller: Patient Call For: young Summary of Call: pt would like flovent hfa called to pharmacy [facility] Initial call taken by: Rickard Patience,  January 03, 2010 4:03 PM  Follow-up for Phone Call        refill sent to pharmacy Follow-up by: Philipp Deputy CMA,  January 03, 2010 4:06 PM    Prescriptions: FLOVENT HFA 44 MCG/ACT  AERO (FLUTICASONE PROPIONATE  HFA) 2 puffs two times a day as needed  #1 x 11   Entered by:   Philipp Deputy CMA   Authorized by:   Waymon Budge MD   Signed by:   Philipp Deputy CMA on 01/03/2010   Method used:   [facility]        [facility]  [address] [phone]* (retail)       [address]       [place], [place]  [address]       Ph: [phone] or [phone]       Fax: [phone]   RxID:   [id number]

## 2010-07-28 NOTE — Letter (Signed)
Summary: [facility]   Imported By: Sherian Rein 07/01/2010 14:30:19  _____________________________________________________________________  External Attachment:    Type:   Image     Comment:   External Document

## 2010-07-29 NOTE — Procedures (Signed)
Summary: [facility]  [facility]   Imported By: Sherian Rein 09/24/2009 09:48:50  _____________________________________________________________________  External Attachment:    Type:   Image     Comment:   External Document

## 2010-07-29 NOTE — Letter (Signed)
Summary: [facility]   Imported By: Sherian Rein 09/24/2009 09:51:20  _____________________________________________________________________  External Attachment:    Type:   Image     Comment:   External Document

## 2010-09-08 ENCOUNTER — Telehealth: Payer: Self-pay | Admitting: Internal Medicine

## 2010-09-13 ENCOUNTER — Ambulatory Visit: Payer: Self-pay | Admitting: Internal Medicine

## 2010-09-13 NOTE — Progress Notes (Signed)
Summary: synthroid  Phone Note Refill Request Message from:  Fax from Pharmacy on September 08, 2010 1:58 PM  Refills Requested: Medication #1:  SYNTHROID 75 MCG TABS 1 by mouth once daily [facility]   Method Requested: Electronic Initial call taken by: Orlan Leavens RMA,  September 08, 2010 1:58 PM    Prescriptions: SYNTHROID 75 MCG TABS (LEVOTHYROXINE SODIUM) 1 by mouth once daily  #90 x 1   Entered by:   Orlan Leavens RMA   Authorized by:   Newt Lukes MD   Signed by:   Orlan Leavens RMA on 09/08/2010   Method used:   [facility]ron[facility] to        [facility]  [address] [phone]* (retail)       [address]       [place], [place]  [address]       Ph: [phone] or [phone]       Fax: [phone]   RxID:   [id number] SYNTHROID 75 MCG TABS (LEVOTHYROXINE SODIUM) 1 by mouth once daily  #30 x 5   Entered by:   Orlan Leavens RMA   Authorized by:   Newt Lukes MD   Signed by:   Orlan Leavens RMA on 09/08/2010   Method used:   Electronically to        [facility]  [address] [phone]* (retail)       [address]       [place], [place]  [address]       Ph: [phone] or [phone]       Fax: [phone]   RxID:   [id number]

## 2010-09-15 ENCOUNTER — Other Ambulatory Visit (INDEPENDENT_AMBULATORY_CARE_PROVIDER_SITE_OTHER): Payer: Medicare Other

## 2010-09-15 ENCOUNTER — Encounter: Payer: Self-pay | Admitting: Internal Medicine

## 2010-09-15 ENCOUNTER — Telehealth: Payer: Self-pay | Admitting: *Deleted

## 2010-09-15 ENCOUNTER — Ambulatory Visit (INDEPENDENT_AMBULATORY_CARE_PROVIDER_SITE_OTHER): Payer: Medicare Other | Admitting: Internal Medicine

## 2010-09-15 DIAGNOSIS — E039 Hypothyroidism, unspecified: Secondary | ICD-10-CM

## 2010-09-15 DIAGNOSIS — F29 Unspecified psychosis not due to a substance or known physiological condition: Secondary | ICD-10-CM

## 2010-09-15 DIAGNOSIS — E785 Hyperlipidemia, unspecified: Secondary | ICD-10-CM

## 2010-09-15 NOTE — Telephone Encounter (Signed)
Pt return call back, gave results concerning labs...09/15/10@3 :59PM/lmb

## 2010-09-15 NOTE — Assessment & Plan Note (Signed)
Lab Results  Component Value Date   TSH 1.02 03/15/2010   The current medical regimen is effective;  continue present plan and medications.

## 2010-09-15 NOTE — Telephone Encounter (Signed)
Message copied by Orlan Leavens on Thu Sep 15, 2010  3:34 PM ------      Message from: Rene Paci ANN      Created: Thu Sep 15, 2010  3:26 PM       Please call patient - normal results. No medication changes recommended. Thanks.

## 2010-09-15 NOTE — Progress Notes (Signed)
  Subjective:    Patient ID: [name], female    DOB: [birth year], 70 y.o.   MRN: [id number]  HPI Here for follow up  1) hypothyroid - reports compliance with ongoing medical treatment and no changes in medication dose or frequency. denies adverse side effects related to current therapy.  has endo at [facility] for bones but follows thyroid with PCP -  2) dyslipidemia -reports compliance with ongoing medical treatment and no changes in medication dose or frequency. denies adverse side effects related to current therapy.   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 Hypothyroid   . Bipolar disease, chronic   . Hyperlipidemia   . Osteoporosis   . Asthma   . GERD (gastroesophageal reflux disease)       Review of Systems  Constitutional: Positive for fatigue. Negative for unexpected weight change.  Respiratory: Negative for shortness of breath.   Cardiovascular: Negative for chest pain.       Objective:   Physical Exam  Constitutional: She appears well-developed and well-nourished. No distress.  Neck: Normal range of motion. Neck supple. No thyromegaly present.  Cardiovascular: Normal rate, regular rhythm and normal heart sounds.   No murmur heard. Pulmonary/Chest: Effort normal and breath sounds normal. No respiratory distress. She has no wheezes.  Skin: Skin is warm and dry. No erythema.  Psychiatric: She has a normal mood and affect. Thought content normal.       Lab Results  Component Value Date   WBC 8.8 03/15/2010   HGB 13.2 03/15/2010   HCT 38.8 03/15/2010    PLT 219.0 03/15/2010   CHOL 178 03/15/2010   TRIG 148.0 03/15/2010   HDL 70.90 03/15/2010   ALT 13 03/15/2010   AST 21 03/15/2010   NA 142 03/15/2010   K 4.9 03/15/2010   CL 108 03/15/2010   CREATININE 0.8 03/15/2010   BUN 12 03/15/2010   CO2 26 03/15/2010   TSH 1.02 03/15/2010    Assessment & Plan:  See problem list. Medications and labs reviewed today.

## 2010-09-15 NOTE — Assessment & Plan Note (Signed)
The current medical regimen is effective;  continue present plan and medications. Lab Results  Component Value Date   LDLCALC 78 03/15/2010

## 2010-09-15 NOTE — Assessment & Plan Note (Signed)
Compensated on current medications - continue to follow with dr. Jennelle Human as ongoing

## 2010-09-15 NOTE — Patient Instructions (Signed)
Good to see you today. Test(s) ordered today. Your results will be called to you after review (48-72hours after test completion). If any changes need to be made, you will be notified at that time. Medications reviewed, no changes at this time. Please call if any problems before your next scheduled visit.

## 2010-10-11 ENCOUNTER — Encounter: Payer: Self-pay | Admitting: Cardiology

## 2010-10-11 DIAGNOSIS — I10 Essential (primary) hypertension: Secondary | ICD-10-CM | POA: Insufficient documentation

## 2010-10-11 DIAGNOSIS — E0590 Thyrotoxicosis, unspecified without thyrotoxic crisis or storm: Secondary | ICD-10-CM | POA: Insufficient documentation

## 2010-10-11 DIAGNOSIS — F419 Anxiety disorder, unspecified: Secondary | ICD-10-CM | POA: Insufficient documentation

## 2010-10-11 DIAGNOSIS — E7800 Pure hypercholesterolemia, unspecified: Secondary | ICD-10-CM | POA: Insufficient documentation

## 2010-10-11 DIAGNOSIS — M810 Age-related osteoporosis without current pathological fracture: Secondary | ICD-10-CM | POA: Insufficient documentation

## 2010-10-12 ENCOUNTER — Ambulatory Visit (INDEPENDENT_AMBULATORY_CARE_PROVIDER_SITE_OTHER): Payer: Medicare Other | Admitting: Cardiology

## 2010-10-12 ENCOUNTER — Encounter: Payer: Self-pay | Admitting: Cardiology

## 2010-10-12 DIAGNOSIS — E785 Hyperlipidemia, unspecified: Secondary | ICD-10-CM

## 2010-10-12 DIAGNOSIS — I10 Essential (primary) hypertension: Secondary | ICD-10-CM

## 2010-10-12 DIAGNOSIS — G3184 Mild cognitive impairment, so stated: Secondary | ICD-10-CM | POA: Insufficient documentation

## 2010-10-12 DIAGNOSIS — R410 Disorientation, unspecified: Secondary | ICD-10-CM

## 2010-10-12 DIAGNOSIS — F29 Unspecified psychosis not due to a substance or known physiological condition: Secondary | ICD-10-CM

## 2010-10-12 NOTE — Assessment & Plan Note (Signed)
Confusion is certainly the prominent issue for her. She does not have any focal neurological findings. She does have a psychiatric history and has a family history of apparent dementia with her father. She certainly concerned about this. We will stop the simvastatin for 3 months and have her see primary care if she is not dramatically better.

## 2010-10-12 NOTE — Progress Notes (Signed)
Subjective:   [name] is seen today in followup. In general, she's been doing well. However, she notes more confusion and is concerned that it may be related to simvastatin. She has a history of hypercholesterolemia. Hypertension has been well-controlled with low-dose anti-anxiety medicines at home. She is not taking labetalol at this point in time. In general, she's been doing well otherwise. She is seeing Dr. Felicity Coyer for primary care.  We will be stopping her simvastatin for 3 months because of her confusion.  Current Outpatient Prescriptions  Medication Sig Dispense Refill  . ALPRAZolam (XANAX) 0.25 MG tablet Take 0.25 mg by mouth every morning. Take 1/2-1 by mouth every morning        . calcium carbonate (TUMS) 500 MG chewable tablet Chew 2 tablets by mouth at bedtime.       . calcium-vitamin D 185 MG TABS Take 185 mg by mouth daily.        Marland Kitchen EPINEPHrine (EPIPEN JR) 0.15 MG/0.3ML injection Inject 0.15 mg into the muscle as needed.        . fluticasone (FLOVENT HFA) 44 MCG/ACT inhaler Inhale 2 puffs into the lungs 2 (two) times daily as needed.        Marland Kitchen levothyroxine (SYNTHROID, LEVOTHROID) 75 MCG tablet Take 75 mcg by mouth daily.        Marland Kitchen lithium (LITHOBID) 300 MG CR tablet Take 300 mg by mouth 2 (two) times daily.        . mometasone (NASONEX) 50 MCG/ACT nasal spray 2 sprays by Nasal route as needed.        . polyethylene glycol (GLYCOLAX) packet Take 17 g by mouth at bedtime.        Marland Kitchen QUEtiapine (SEROQUEL) 25 MG tablet Take 25 mg by mouth at bedtime. Take 1/2 by mouth once daily       . simvastatin (ZOCOR) 20 MG tablet Take 20 mg by mouth at bedtime.        Marland Kitchen DISCONTD: albuterol (PROAIR HFA) 108 (90 BASE) MCG/ACT inhaler Inhale 2 puffs into the lungs 4 (four) times daily as needed.          Allergies  Allergen Reactions  . Lidocaine   . Pneumococcal Vaccines     Patient Active Problem List  Diagnoses  . HYPOTHYROIDISM  . DYSLIPIDEMIA  . UNSPECIFIED PSYCHOSIS  . ASTHMA  .  UNSPECIFIED OSTEOPOROSIS  . FATIGUE  . Confusion    History  Smoking status  . Passive Smoker  Smokeless tobacco  . Never Used    History  Alcohol Use No    Family History  Problem Relation Age of Onset  . Dementia Father   . Breast cancer Maternal Aunt     Review of Systems:   The patient denies any heat or cold intolerance.  No weight gain or weight loss.  The patient denies headaches or blurry vision.  There is no cough or sputum production.  The patient denies dizziness.  There is no hematuria or hematochezia.  The patient denies any muscle aches or arthritis.  The patient denies any rash.  The patient denies frequent falling or instability.  There is no history of depression or anxiety.  All other systems were reviewed and are negative.   Physical Exam:   Weight is 157. Blood pressure is 158/92 sitting, heart rate is 70.The head is normocephalic and atraumatic.  Pupils are equally round and reactive to light.  Sclerae nonicteric.  Conjunctiva is clear.  Oropharynx is unremarkable.  There's adequate oral airway.  Neck is supple there are no masses.  Thyroid is not enlarged.  There is no lymphadenopathy.  Lungs are clear.  Chest is symmetric.  Heart shows a regular rate and rhythm.  S1 and S2 are normal.  There is no murmur click or gallop.  Abdomen is soft normal bowel sounds.  There is no organomegaly.  Genital and rectal deferred.  Extremities are without edema.  Peripheral pulses are adequate.  Neurologically intact.  Full range of motion.  The patient is not depressed.  Skin is warm and dry. Assessment / Plan:

## 2010-10-12 NOTE — Assessment & Plan Note (Signed)
We will be stopping simvastatin because of concern about confusion. She's been on statin therapy for some in quite frankly, I doubt that her confusion is related to statin therapy.

## 2010-10-12 NOTE — Assessment & Plan Note (Signed)
She will check her blood pressure readings at home. She will follow with Dr. Felicity Coyer. She's had a history of palpitations and would like to see Dr. Shirlee Latch in one year.

## 2010-10-13 ENCOUNTER — Ambulatory Visit (INDEPENDENT_AMBULATORY_CARE_PROVIDER_SITE_OTHER): Payer: Medicare Other

## 2010-10-13 DIAGNOSIS — J309 Allergic rhinitis, unspecified: Secondary | ICD-10-CM

## 2010-11-08 NOTE — Assessment & Plan Note (Signed)
[facility]                             PULMONARY OFFICE NOTE   FOYE, HAGGART                   MRN:          [id number]  DATE:04/16/2007                            DOB:          1940/08/17    HISTORY OF PRESENT ILLNESS:  Patient is a 70 year old white female  patient of Dr. Roxy Cedar who has a known history of eosinophilic pneumonia  and asthma that presents today for an acute office visit.  Patient  complains over the last week she has had left ear pressure and  stuffiness.  Patient complains it is intermittent.  She denies any  drainage, chest pain or shortness of breath.  Patient does complain of  some nasal congestion and post nasal drip symptoms.  Patient has not  been taking her Nasonex on a regular basis.   PAST MEDICAL HISTORY:  Reviewed.   CURRENT MEDICATIONS:  Reviewed.   PHYSICAL EXAMINATION:  VITAL SIGNS:  She is afebrile with stable vital  signs.  GENERAL APPEARANCE:  Patient is a pleasant female in no acute distress.  HEENT:  Nasal mucosa is pale.  Nontender sinuses.  Conjunctivae not  injected.  Pharynx is clear.  TMs are normal without any bulging.  Left  external auditory canal with some mild redness.  NECK:  Supple without adenopathy.  No JVD.  LUNGS:  Clear.  CARDIOVASCULAR:  Regular rate.  ABDOMEN:  Soft and nontender.  EXTREMITIES:  Warm without any edema.   IMPRESSION AND PLAN:  1. Acute rhinitis flare and eustachian tube dysfunction.  Patient is      to begin Xyzal 5 mg x5 days.  Restart Nasonex two puffs twice      daily.  Saline nasal spray.  Patient was asked to return back with      Dr. Maple Hudson or sooner if needed.  2. Mild left external otitis media.  Patient to begin Cipro Otic drops      three drops to the left twice daily x7 days.  3. Patient to return back as scheduled or sooner if needed.     Rubye Oaks, NP  Electronically Signed      Clinton D. Maple Hudson, MD, [facility], FACP  Electronically  Signed   TP/MedQ  DD: 04/16/2007  DT: 04/17/2007  Job #: [id number]

## 2010-11-11 NOTE — Assessment & Plan Note (Signed)
[facility]                               PULMONARY OFFICE NOTE   [name], [name]                   MRN:          [id number]  DATE:02/23/2006                            DOB:          [birth year]    PROBLEMS:  1. Lung nodule.  2. Remote history of eosinophilic pneumonia.  3. Asthma.   HISTORY:  This is a never smoker with a history of second hand smoke  exposure and previous bipolar disorder who comes now for one-year followup.  She says this has been an unusual summer in that she is needing to use her  Flovent 39 on a regular basis all summer.  She does not when she had to do  that in the last several years.  She has not had an albuterol inhaler but  has not had significant loss of control.  Nasonex is used at intervals  p.r.n.  We discussed her allergy vaccine which is given by her husband.  She  is at 1 to 10 and tolerates it very well, feeling that it has helped her.  We discussed a policy concerning administration outside of the medical  office, anaphylaxis, epinephrine, and choices.  She reviewed a signed our  waiver wishing to continue as she is doing and we gave her an up-dated  EpiPen prescription.  Her CT scan in April 2006 was normal with no evidence  of a pulmonary nodule as had been previously concern.   MEDICATIONS:  1. Synthroid.  2. Pepcid.  3. Aspirin 81 mg use three days a week.  4. Lithobid SR.  5. Glycolax.  6. Seroquel one-half x 25 mg.  7. Tums.  8. Prevacid.  9. Nasonex.  10.Flovent 44 two puffs b.i.d.  11.EpiPen.   DRUG INTOLERANCE:  LIDOCAINE.   She expressed concern about a mild dorsal kyphosis and began a discussion of  her considerations of cosmetic surgery which I addressed only from a  pulmonary/anesthesia background.  If she goes forward with any serious  consideration, then we will probably want to get PFTs done.   OBJECTIVE:  VITAL SIGNS: Weight 154 pounds, BP 132/84, pulse regular 81,  room  air saturation 99%.  '  GENERAL:  She is in no evident distress.  HEENT:  Conjunctivae are clear.  Nasal mucosa is clear and not obstructed.  Pharynx is clear.  NECK:  There is no neck vein distention or stridor.  LUNGS:  Quiet, clear and unlabored with no crackles, rales or wheeze.  HEART:  Regular without murmur or gallop.  EXTREMITIES:  No edema, cyanosis or clubbing.   IMPRESSION:  1. Asthma as controlled with a maintenance steroid inhaler.  She needs a      rescue inhaler available.  2. Allergy vaccine is stable with educational discussion as above.   PLAN:  1. EpiPen refill discussed.  2. Chest x-ray.  3. Refill albuterol HFA inhaler with discussion.  4. Continue Flovent 44 two puffs b.i.d., considering increase to Flovent      110 if she is not well enough controlled.  5. Schedule return one year, earlier p.r.n. Consider  PFT.                                   Clinton D. Maple Hudson, MD, [facility], FACP   CDY/MedQ  DD:  02/24/2006  DT:  02/26/2006  Job #:  [id number]   cc:   Thora Lance, MD

## 2010-11-11 NOTE — Procedures (Signed)
[facility]  Patient:    [name], [name] Visit Number: [id number] MRN: [id number]          Service Type: Attending:  Verlin Grills, M.D. Dictated by:   Verlin Grills, M.D. Proc. Date: 07/29/01   CC:         Thora Lance, M.D.   Procedure Report  PROCEDURE:  Esophagogastroduodenoscopy and screening flexible proctosigmoidoscopy.  REFERRING PHYSICIAN:  Thora Lance, M.D.  PROCEDURE INDICATION:  [name] is a 70 year old female born 04-Aug-1940. Ms. Marcella has chronic gastroesophageal reflux manifested by heartburn. She denies dysphagia or odynophagia. She is referred for diagnostic esophagogastroduodenoscopy to rule out Barretts esophagus.  [name] is also due for her first screening colonoscopy with polpectomy to prevent colon cancer. She viewed our colonoscopy education film. I discussed with her the complications associated with colonoscopy and polypectomy including a 15 per 1000 risk of bleeding and 4 per 1000 risk of colon perforation requiring surgical repair. [name] has signed the operative permit.  MEDICATION ALLERGIES:  NOVOCAINE WITH EPINEPHRINE causes palpitations.  CHRONIC MEDICATIONS:  Synthroid, Nexium, Actonel, Lithobid, Restoril, vitamin E, aspirin, calcium, multivitamin, Vanceril inhaler, Nasonex, immunotherapy.  PAST MEDICAL HISTORY:  Bipolar disorder, hypothyroidism, seasonal allergic rhinitis, eosinophilic pneumonia, reactive airway disease, costochondritis, ulnar neuropathy, right trochanteric bursitis, sinus surgery, tonsillectomy, nasal polypectomy, right Achilles tendon surgery.  ENDOSCOPIST:  Verlin Grills, M.D.  PREMEDICATION:  Demerol 75 mg, Versed 10 mg.  ENDOSCOPE:  Olympus gastroscope and pediatric colonoscope.  DESCRIPTION OF PROCEDURE:  Esophagogastroduodenoscopy. After obtaining informed consent, [name] was placed in the left lateral decubitus position. I  administered intravenous Demerol and intravenous Versed to achieve conscious sedation for the procedure. The patients blood pressure, oxygen saturation and cardiac rhythm were monitored throughout the procedure and documented in the medical record.  The Olympus gastroscope was advanced through the posterior hypopharynx into the proximal esophagus without difficulty. The hypopharynx, larynx, and vocal cords appeared normal.  Esophagoscopy:  The proximal, mid, and lower segments of the esophagus appeared normal. The squamocolumnar junction and the esophagogastric junction are noted at approximately 40 cm from the incisor teeth. Endoscopically, there is no evidence for the presence of Barretts esophagus, esophageal mucosal scarring, erosive esophagitis, or esophageal ulceration.  Gastroscopy:  Retroflex view of the gastric cardia and fundus was normal. The gastric body, antrum, and pylorus appeared completely normal.  Duodenoscopy:  The duodenal bulb and descending duodenum appeared normal.  ASSESSMENT:  Normal esophagogastroduodenoscopy.  DESCRIPTION OF PROCEDURE: Flexible proctosigmoidoscopy. Anal inspection was normal. Digital rectum exam was normal. The Olympus pediatric video colonoscope was introduced into the rectum and advanced to approximately 60 cm from the anal verge. Due to colonic loop formation, a complete colonoscopy could not be performed. Colonic preparation for the exam today was excellent.  Endoscopic appearance of the rectum and colon with the endoscope advanced to 60 cm from the anal verge was completely normal. There is no endoscopic evidence for the presence of colorectal neoplasia.  ASSESSMENT:  Normal screening flexible proctosigmoidoscopy to 60 cm. Dictated by:   Verlin Grills, M.D. Attending:  Verlin Grills, M.D. DD:  07/29/01 TD:  07/29/01 Job: [id number]

## 2010-12-30 ENCOUNTER — Telehealth: Payer: Self-pay | Admitting: Cardiology

## 2010-12-30 NOTE — Telephone Encounter (Signed)
PATIENT HAS BEEN ASSIGNED TO DR.DALTON MCLEAN. Patient wants to her her cholesterol checked since Dr. Deborah Chalk changed her cholesterol medication last time she was here. She would also like her Lithium level checked.  I did not find any orders for labs under the 10/12/10 Assessment and Plan on the [place] [facility]. Natasha Bence suggested I route this note to you.

## 2011-01-04 ENCOUNTER — Telehealth: Payer: Self-pay | Admitting: Cardiology

## 2011-01-04 DIAGNOSIS — Z79899 Other long term (current) drug therapy: Secondary | ICD-10-CM

## 2011-01-04 NOTE — Telephone Encounter (Signed)
Pt notified labs ordered per her request (lipids/liver function test) Deferred lithium level to PCP. Pt verbalizes understanding. Also informed her that labs will be collect at [facility] at [address] [facility] [address] phone number provided [phone].

## 2011-01-04 NOTE — Telephone Encounter (Signed)
PT SAID SHE IS SUPPOSE TO HAVE A SERIES OF BLOOD WORK DONE AND NO ONE HAS CALLED HER. SHE HAS A RECALL TO SEE DR [facility] IN 09/2011. DO NOT SEE AN APPT FOR LABS. CALL PATIENT BACK TODAY. BELIEVES SHE IS OVERDUE FOR THESE. SEARCHING FOR CHART.

## 2011-01-06 ENCOUNTER — Other Ambulatory Visit: Payer: Self-pay | Admitting: *Deleted

## 2011-01-06 ENCOUNTER — Other Ambulatory Visit (INDEPENDENT_AMBULATORY_CARE_PROVIDER_SITE_OTHER): Payer: Medicare Other | Admitting: *Deleted

## 2011-01-06 ENCOUNTER — Other Ambulatory Visit: Payer: Self-pay | Admitting: Nurse Practitioner

## 2011-01-06 DIAGNOSIS — Z79899 Other long term (current) drug therapy: Secondary | ICD-10-CM

## 2011-01-06 LAB — HEPATIC FUNCTION PANEL
ALT: 11 U/L (ref 0–35)
AST: 23 U/L (ref 0–37)
Albumin: 4.6 g/dL (ref 3.5–5.2)
Alkaline Phosphatase: 56 U/L (ref 39–117)
Bilirubin, Direct: 0.2 mg/dL (ref 0.0–0.3)
Total Bilirubin: 0.5 mg/dL (ref 0.3–1.2)
Total Protein: 7.1 g/dL (ref 6.0–8.3)

## 2011-01-06 LAB — LIPID PANEL
Cholesterol: 236 mg/dL — ABNORMAL HIGH (ref 0–200)
HDL: 71.1 mg/dL (ref >39.00)
Total CHOL/HDL Ratio: 3
Triglycerides: 98 mg/dL (ref 0.0–149.0)
VLDL: 19.6 mg/dL (ref 0.0–40.0)

## 2011-01-06 LAB — LDL CHOLESTEROL, DIRECT: Direct LDL: 161.3 mg/dL

## 2011-01-10 ENCOUNTER — Telehealth: Payer: Self-pay | Admitting: *Deleted

## 2011-01-10 DIAGNOSIS — E7800 Pure hypercholesterolemia, unspecified: Secondary | ICD-10-CM

## 2011-01-10 MED ORDER — PRAVASTATIN SODIUM 20 MG PO TABS: 20.0000 mg | ORAL_TABLET | Freq: Every evening | ORAL | Status: DC

## 2011-01-10 NOTE — Telephone Encounter (Signed)
Returned your phone call. Is really concerned and anxious about everything. Really, really nervous about what's going to happen to her files. This whole transition period between the new cardiologists and the separate LAB location really has her confused. Please call back.

## 2011-01-10 NOTE — Telephone Encounter (Signed)
Message copied by Lorayne Bender on Tue Jan 10, 2011  9:22 AM ------      Message from: Rosalio Macadamia      Created: Fri Jan 06, 2011  3:23 PM       Ok to report. ? If taking Zocor 20mg . LDL very high. Would increase to 40 mg per day. Recheck labs in 3 months.

## 2011-01-10 NOTE — Telephone Encounter (Signed)
Notified of lab results. She states Simvastatin caused her to have memory loss;unable to think clearly. She is willing to go on another meds. Per Lawson Fiscal will try Pravachol 20 mg. Will see in 3 mo w/ fasting labs. Will send Rx into [facility].

## 2011-02-01 ENCOUNTER — Ambulatory Visit (INDEPENDENT_AMBULATORY_CARE_PROVIDER_SITE_OTHER): Payer: Medicare Other | Admitting: Internal Medicine

## 2011-02-01 ENCOUNTER — Encounter: Payer: Self-pay | Admitting: Internal Medicine

## 2011-02-01 ENCOUNTER — Other Ambulatory Visit (INDEPENDENT_AMBULATORY_CARE_PROVIDER_SITE_OTHER): Payer: Medicare Other

## 2011-02-01 ENCOUNTER — Other Ambulatory Visit (INDEPENDENT_AMBULATORY_CARE_PROVIDER_SITE_OTHER): Payer: Medicare Other | Admitting: Internal Medicine

## 2011-02-01 VITALS — BP 118/80 | HR 80 | Temp 98.1°F | Ht 67.0 in | Wt 155.0 lb

## 2011-02-01 DIAGNOSIS — Z79899 Other long term (current) drug therapy: Secondary | ICD-10-CM

## 2011-02-01 DIAGNOSIS — F29 Unspecified psychosis not due to a substance or known physiological condition: Secondary | ICD-10-CM

## 2011-02-01 DIAGNOSIS — Z0000 Encounter for general adult medical examination without abnormal findings: Secondary | ICD-10-CM

## 2011-02-01 DIAGNOSIS — Z9181 History of falling: Secondary | ICD-10-CM

## 2011-02-01 DIAGNOSIS — R410 Disorientation, unspecified: Secondary | ICD-10-CM

## 2011-02-01 DIAGNOSIS — E039 Hypothyroidism, unspecified: Secondary | ICD-10-CM

## 2011-02-01 DIAGNOSIS — R300 Dysuria: Secondary | ICD-10-CM

## 2011-02-01 DIAGNOSIS — R296 Repeated falls: Secondary | ICD-10-CM

## 2011-02-01 LAB — URINALYSIS, ROUTINE W REFLEX MICROSCOPIC
Nitrite: NEGATIVE
Specific Gravity, Urine: 1.01 (ref 1.000–1.030)
Total Protein, Urine: NEGATIVE
pH: 7 (ref 5.0–8.0)

## 2011-02-01 LAB — CBC WITH DIFFERENTIAL/PLATELET
Basophils Relative: 0.5 % (ref 0.0–3.0)
Eosinophils Absolute: 0.8 10*3/uL — ABNORMAL HIGH (ref 0.0–0.7)
Eosinophils Relative: 8 % — ABNORMAL HIGH (ref 0.0–5.0)
Hemoglobin: 12.6 g/dL (ref 12.0–15.0)
Lymphocytes Relative: 11.4 % — ABNORMAL LOW (ref 12.0–46.0)
MCHC: 33.6 g/dL (ref 30.0–36.0)
MCV: 90.8 fl (ref 78.0–100.0)
Neutro Abs: 6.8 10*3/uL (ref 1.4–7.7)
Neutrophils Relative %: 72.6 % (ref 43.0–77.0)
RBC: 4.12 Mil/uL (ref 3.87–5.11)
WBC: 9.4 10*3/uL (ref 4.5–10.5)

## 2011-02-01 LAB — HEPATIC FUNCTION PANEL
ALT: 12 U/L (ref 0–35)
Bilirubin, Direct: 0.1 mg/dL (ref 0.0–0.3)
Total Bilirubin: 0.7 mg/dL (ref 0.3–1.2)
Total Protein: 7.5 g/dL (ref 6.0–8.3)

## 2011-02-01 LAB — BASIC METABOLIC PANEL
BUN: 11 mg/dL (ref 6–23)
Calcium: 9.7 mg/dL (ref 8.4–10.5)
Creatinine, Ser: 0.9 mg/dL (ref 0.4–1.2)

## 2011-02-01 LAB — TSH: TSH: 1.23 u[IU]/mL (ref 0.35–5.50)

## 2011-02-01 LAB — VITAMIN B12: Vitamin B-12: 421 pg/mL (ref 211–911)

## 2011-02-01 MED ORDER — VITAMIN D 1000 UNITS PO TABS: 1000.0000 [IU] | ORAL_TABLET | Freq: Every day | ORAL | Status: DC

## 2011-02-01 NOTE — Progress Notes (Signed)
Subjective:    Patient ID: [name], female    DOB: [birth year], 70 y.o.   MRN: [id number]  HPI  Here for follow up complains of falls - 3 events in last 2 weeks 1st accidental - blamed on tripping on g0kid toys, other 2 unprovokerd No LOC, no headache or chest pain or dizziness - +confusion (mild) for last 6 weeks per spouse  Pt also notes +UTI like burning and freq, no incontinence   hypothyroid - reports compliance with ongoing medical treatment and no changes in medication dose or frequency. denies adverse side effects related to current therapy.  has endo at [facility] for bones but follows thyroid with PCP -  dyslipidemia - cards changed simva to prava 12/2010 due to memory problems on simva - since med change, pt feels improved -reports compliance with ongoing medical treatment and no changes in medication dose or frequency. denies adverse side effects related to current therapy.   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 Hypothyroid   . Bipolar disease, chronic   . Hyperlipidemia   . Osteoporosis   . Asthma   . GERD (gastroesophageal reflux disease)     Review of Systems  Constitutional: Positive for fatigue. Negative for unexpected weight change.  Respiratory: Negative for shortness of breath.   Cardiovascular: Negative for chest pain.       Objective:   Physical Exam BP 118/80  Pulse 80  Temp(Src) 98.1 F (36.7 C) (Oral)  Ht 5\' 7"  (1.702 m)  Wt 155 lb (70.308 kg)  BMI 24.28 kg/m2  SpO2 95% Constitutional: She appears well-developed and well-nourished.  No distress. spouse at side Neck: Normal range of motion. Neck supple. No thyromegaly present.  Cardiovascular: Normal rate, regular rhythm and normal heart sounds.  No murmur heard. no BLE edema Pulmonary/Chest: Effort normal and breath sounds normal. No respiratory distress. She has no wheezes.  Neuro: AAOx3, CN 2-12 symmetrically intact - MAE well with 5/5 strength - normal F>N and H>S, no balance problems, negative romberg - sensation intact. Gait observed and normal MSkel - L knee with min effusion, FROM, nontender on joint line and ligamentous fx intact; R ankle also mild effusion but FROM, nonpainful and lig intact Psychiatric: She has a normal mood and affect. Thought content and judgment normal.      Lab Results  Component Value Date   WBC 8.8 03/15/2010   HGB 13.2 03/15/2010   HCT 38.8 03/15/2010   PLT 219.0 03/15/2010   CHOL 236* 01/06/2011   TRIG 98.0 01/06/2011   HDL 71.10 01/06/2011   LDLDIRECT 161.3 01/06/2011   ALT 11 01/06/2011   AST 23 01/06/2011   NA 142 03/15/2010   K 4.9 03/15/2010   CL 108 03/15/2010   CREATININE 0.8 03/15/2010   BUN 12 03/15/2010   CO2 26 03/15/2010   TSH 0.55 09/15/2010    Assessment & Plan:  See problem list. Medications and labs reviewed today.  Falls, unprovoked - associated with mild confusion (?statin related per spouse) and new "  bladder problems" (?UTI) - check screening labs and head ct - ?NPH - neuro exam benign today but consider consultation if eval unremarkable

## 2011-02-01 NOTE — Patient Instructions (Signed)
It was good to see you today. We have reviewed your prior records including labs and tests today Medications reviewed, no changes at this time. Test(s) ordered today. Your results will be called to you after review (48-72hours after test completion). If any changes need to be made, you will be notified at that time. we'll make referral for head ct. Our office will contact you regarding appointment(s) once made. Further testing or treatment will depend on these results Please schedule routine followup in 3-4 months for thyroid and medication review, call sooner if problems.

## 2011-02-02 ENCOUNTER — Ambulatory Visit (INDEPENDENT_AMBULATORY_CARE_PROVIDER_SITE_OTHER)
Admission: RE | Admit: 2011-02-02 | Discharge: 2011-02-02 | Disposition: A | Discharge status: Home / Self Care | Payer: Medicare Other | Source: Ambulatory Visit | Attending: Internal Medicine | Admitting: Internal Medicine

## 2011-02-02 DIAGNOSIS — F29 Unspecified psychosis not due to a substance or known physiological condition: Secondary | ICD-10-CM

## 2011-02-02 DIAGNOSIS — Z9181 History of falling: Secondary | ICD-10-CM

## 2011-02-02 DIAGNOSIS — R296 Repeated falls: Secondary | ICD-10-CM

## 2011-02-02 DIAGNOSIS — R410 Disorientation, unspecified: Secondary | ICD-10-CM

## 2011-02-02 LAB — VITAMIN D 25 HYDROXY (VIT D DEFICIENCY, FRACTURES): Vit D, 25-Hydroxy: 48 ng/mL (ref 30–89)

## 2011-02-03 ENCOUNTER — Other Ambulatory Visit: Payer: Self-pay | Admitting: Internal Medicine

## 2011-02-03 DIAGNOSIS — W19XXXA Unspecified fall, initial encounter: Secondary | ICD-10-CM

## 2011-02-03 DIAGNOSIS — R413 Other amnesia: Secondary | ICD-10-CM

## 2011-02-15 ENCOUNTER — Encounter: Payer: Self-pay | Admitting: Internal Medicine

## 2011-02-15 ENCOUNTER — Ambulatory Visit (INDEPENDENT_AMBULATORY_CARE_PROVIDER_SITE_OTHER): Payer: Medicare Other | Admitting: Internal Medicine

## 2011-02-15 ENCOUNTER — Ambulatory Visit (INDEPENDENT_AMBULATORY_CARE_PROVIDER_SITE_OTHER)
Admission: RE | Admit: 2011-02-15 | Discharge: 2011-02-15 | Disposition: A | Discharge status: Home / Self Care | Payer: Medicare Other | Source: Ambulatory Visit | Attending: Internal Medicine | Admitting: Internal Medicine

## 2011-02-15 VITALS — BP 120/82 | HR 73 | Temp 98.2°F | Ht 66.0 in

## 2011-02-15 DIAGNOSIS — M25579 Pain in unspecified ankle and joints of unspecified foot: Secondary | ICD-10-CM

## 2011-02-15 DIAGNOSIS — M25572 Pain in left ankle and joints of left foot: Secondary | ICD-10-CM

## 2011-02-15 DIAGNOSIS — M25562 Pain in left knee: Secondary | ICD-10-CM

## 2011-02-15 DIAGNOSIS — M25569 Pain in unspecified knee: Secondary | ICD-10-CM

## 2011-02-15 DIAGNOSIS — M25561 Pain in right knee: Secondary | ICD-10-CM

## 2011-02-15 MED ORDER — MELOXICAM 15 MG PO TABS: 15.0000 mg | ORAL_TABLET | Freq: Every day | ORAL | Status: DC

## 2011-02-15 NOTE — Patient Instructions (Signed)
It was good to see you today. Test(s) ordered today. Your results will be called to you after review (48-72hours after test completion). If any changes need to be made, you will be notified at that time. Use meloxicam daily for next 7 days, then as needed for pain as discussed - Your prescription(s) have been submitted to your pharmacy. Please take as directed and contact our office if you believe you are having problem(s) with the medication(s).

## 2011-02-15 NOTE — Progress Notes (Signed)
  Subjective:    Patient ID: [name], female    DOB: [birth year], 70 y.o.   MRN: [id number]  HPI  complains of knee pain, bilateral Also left ankle pain with swelling over lateral ankle Onset 6 weeks ago following accidental trauma/fall (see prior OV) No swelling in knees or redness Pain is worst in AM, improves as day goes on No fever - no joint pains in hips, wrists, hands or shoulders Improved with otc ibuprofen > tylenol No further falls or trauma  Past Medical History  Diagnosis Date  . Hypothyroid   . Bipolar disease, chronic   . Hyperlipidemia   . Osteoporosis   . Asthma   . GERD (gastroesophageal reflux disease)     Review of Systems  Constitutional: Negative for fever.  Musculoskeletal: Negative for back pain.  Psychiatric/Behavioral: Negative for confusion.       Objective:   Physical Exam BP 120/82  Pulse 73  Temp(Src) 98.2 F (36.8 C) (Oral)  Ht 5\' 6"  (1.676 m)  SpO2 91% Constitutional: She is oriented to person, place, and time. She appears well-developed and well-nourished. No distress. spouse at side Cardiovascular: Normal rate, regular rhythm and normal heart sounds.  No murmur heard. No BLE edema. Pulmonary/Chest: Effort normal and breath sounds normal. No respiratory distress. She has no wheezes.  Musculoskeletal: mild boggy synovitis B knees; FROM but tender to palpation on joint line - ligamentous function intact/stable; L ankle with swelling over peroneal tendon - nontender, no joint effusions at ankle. No gross deformities.  Skin: Skin is warm and dry. No rash noted. No erythema or bruising.  Psychiatric: She has a normal mood and affect. Her behavior is normal. Judgment and thought content normal.        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

## 2011-02-23 ENCOUNTER — Other Ambulatory Visit (INDEPENDENT_AMBULATORY_CARE_PROVIDER_SITE_OTHER): Payer: Medicare Other

## 2011-02-23 ENCOUNTER — Encounter: Payer: Self-pay | Admitting: Neurology

## 2011-02-23 ENCOUNTER — Ambulatory Visit (INDEPENDENT_AMBULATORY_CARE_PROVIDER_SITE_OTHER): Payer: Medicare Other | Admitting: Neurology

## 2011-02-23 VITALS — BP 142/88 | HR 80 | Wt 156.0 lb

## 2011-02-23 DIAGNOSIS — R7309 Other abnormal glucose: Secondary | ICD-10-CM

## 2011-02-23 DIAGNOSIS — D332 Benign neoplasm of brain, unspecified: Secondary | ICD-10-CM

## 2011-02-23 DIAGNOSIS — G609 Hereditary and idiopathic neuropathy, unspecified: Secondary | ICD-10-CM

## 2011-02-23 DIAGNOSIS — R413 Other amnesia: Secondary | ICD-10-CM

## 2011-02-23 DIAGNOSIS — D320 Benign neoplasm of cerebral meninges: Secondary | ICD-10-CM

## 2011-02-23 DIAGNOSIS — R7302 Impaired glucose tolerance (oral): Secondary | ICD-10-CM

## 2011-02-23 DIAGNOSIS — G629 Polyneuropathy, unspecified: Secondary | ICD-10-CM

## 2011-02-23 LAB — CBC WITH DIFFERENTIAL/PLATELET
Basophils Relative: 0.7 % (ref 0.0–3.0)
Eosinophils Absolute: 1.5 10*3/uL — ABNORMAL HIGH (ref 0.0–0.7)
HCT: 39 % (ref 36.0–46.0)
Hemoglobin: 12.6 g/dL (ref 12.0–15.0)
Lymphocytes Relative: 12.8 % (ref 12.0–46.0)
Lymphs Abs: 1.1 10*3/uL (ref 0.7–4.0)
MCHC: 32.4 g/dL (ref 30.0–36.0)
Neutro Abs: 5.3 10*3/uL (ref 1.4–7.7)
RBC: 4.23 Mil/uL (ref 3.87–5.11)

## 2011-02-23 LAB — COMPREHENSIVE METABOLIC PANEL
AST: 20 U/L (ref 0–37)
Alkaline Phosphatase: 62 U/L (ref 39–117)
BUN: 13 mg/dL (ref 6–23)
Calcium: 9.9 mg/dL (ref 8.4–10.5)
Chloride: 106 mEq/L (ref 96–112)
Creatinine, Ser: 0.9 mg/dL (ref 0.4–1.2)
Total Bilirubin: 0.6 mg/dL (ref 0.3–1.2)

## 2011-02-23 LAB — HEMOGLOBIN A1C: Hgb A1c MFr Bld: 5.7 % (ref 4.6–6.5)

## 2011-02-23 NOTE — Patient Instructions (Signed)
Go to the basement today to have your labs drawn.  We have scheduled your MRI for Thursday, Sept 6th at 3:00pm at [facility].  Please arrive by 2:45pm  We will call you with your appointment to [facility].  cc:  Rene Paci, MD

## 2011-02-23 NOTE — Progress Notes (Signed)
Dear Dr. Felicity Coyer,  Thank you for having me see Ms. Natale Lay in consultation today for her problems with memory difficulties and falls. As you may recall she is a 70 year old woman with a history of bipolar disorder and hypothyroidism who reports that over the last year she has had worsening memory. She associates this with starting on an unknown medication. She could not give the exact length of time she was on that medication nor when it was stopped but she doesn't think she has been doing better since stopping the medication. Unfortunately she is unaccompanied today so I don't have access to a third-party observer who may confirm her statement. In any case she does state that she has developed problems remembering tasks that she is due to perform during the day. She said that she used to be very good at planning meals in advance and frequently entertained large groups of people but she is unable to do that currently. On the functional activities questionnaire she scored a total of 4 points. She feels that he should have some difficulty playing bridge like she used to,  remembering appointments, shopping as mentioned above as well as taking care of her finances.  She denies any interval worsening in her depression. Her bipolar has been quite stable for several years.  With respect to her falling this has occurred 3 times. The first time occurred when she tripped over some chairs. The second were spontaneous one on the dance floor and one while walking up to her house. She cannot give the mechanism but thinks she may have tripped. There was no loss of consciousness associated with these. She does not endorse feeling terribly unsteady on her feet. She says that she is trying to slow down her walking.  In addition she has had spells of confusion. These do not seem to be clearly delineated and their nature is a little opaque.  She does say that she remembers them, and thinks she gets typically confused over  the date and time.  She does not think that she has been missed taking any of her medications or taking too much of them. Of note her psychotropic medications have not changed in several years.  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 Hyperlipidemia. Bipolar disorder currently maintained on lithium.  Also history of eosinophilic PNA.  Social history: She lives with her husband. She does not smoke or use alcohol.  Family history: Questionable history of dementia.  Medications:  Alprazolam, quetiapine, lithium.  Review of systems: 14 systems were reviewed and are significant for bilateral knee pain secondary to her fall as well as left ankle pain. Other review of systems are negative.  Exam: Filed Vitals:   02/23/11 1020  BP: 142/88  Pulse: 80   In general she is a well appearing older woman in no acute distress. She tends to evert my gaze.  Head and neck is supple without lymphadenopathy. Eyes are non-injected. Chest is clear to auscultation bilaterally. Cardiovascular system reveals a regular rate and rhythm. Skin exam does not reveal any significant rashes. MSK exam reveals a swollen left ankle and right knee. Neurologically she is oriented x4 and her language is intact. Mini-Mental status examination score at 28/30 with 2 points lost for the 3 words recall. Semantic naming and syntactic naming and 16 words each. Luria hand test was completed without problems.  Pupils are equal round and reactive to light. Extraocular movements did not reveal any significant square wave jerks. She does  have a mildly blunted affect. Face is symmetric tongue is midline. Shoulder shrug intact. She has a minor postural tremor. She has no pronator drift. Rapid alternating movements reveal no decreased speed. She has 5 out of 5 muscle strength bilaterally. Tongue and bulk are normal. Coordination reveals no dysdiadochokinesia and finger to nose and heel to shin testing are  normal. Sensory testing reveals a length-dependent sensory loss to both vibration and temperature in the arms and legs. Reflexes are 2+ throughout with toes downgoing. She does not have a jaw jerk. Casual gait and station are mildly wide-based. Romberg is negative.  CT of the head was reviewed and revealedl a stable hyperdense lesion in the parasellar region. There is no significant atrophy.  Impression: [name] is a 70 year old woman with a history of bipolar disorder and hypothyroidism who is been having memory problems and falls. Her neurologic exam  reveals mild impairment in her 3 word recall. There is certainly no signs of a movement disorder here such as Parkinson's. She also seems to have a mild to moderate peripheral neuropathy that could be contributing to her problems with falling. Certainly given her relatively good mini mental status examination as well as her intact semantic and syntactic naming and frontal assessment there does not seem to be an overt dementia here. I would like to have her seen by a neuropsychologist for further testing. I will get an MRI of her brain to look at her hippocampi bilaterally. In addition I would send off peripheral neuropathy labs in addition to the ones that you have previously sent off yourself.  We'll see the patient back after her neuropsych testing. At that point in time I will consider whether we need to get an EMG to confirm her neuropathy.  I may also get a C-spine image as her reflexes are quite good in her ankles which does introduce the possibility that her sensory loss is coming from a myelopathy.  This is less likely given the lack of spasticity however.    Thank you for having [place] see this patient in consultation.  Feel free to contact me with any questions.  Lupita Raider Modesto Charon, MD [facility], [place] [address] [place], [place] [address] Phone: [phone] Fax: [phone].

## 2011-02-24 NOTE — Progress Notes (Signed)
Addended by: Lelon Huh on: 02/24/2011 08:15 AM   Modules accepted: Orders

## 2011-02-28 ENCOUNTER — Telehealth: Payer: Self-pay

## 2011-02-28 LAB — PROTEIN ELECTROPHORESIS, SERUM
Beta 2: 3.7 % (ref 3.2–6.5)
Gamma Globulin: 12.5 % (ref 11.1–18.8)

## 2011-02-28 NOTE — Telephone Encounter (Signed)
Pt set up for neuropsych eval at [facility] on Oct. 2nd at 12:30pm.  [name] husband aware.

## 2011-03-02 ENCOUNTER — Ambulatory Visit (HOSPITAL_COMMUNITY)
Admission: RE | Admit: 2011-03-02 | Discharge: 2011-03-02 | Disposition: A | Discharge status: Home / Self Care | Payer: Medicare Other | Source: Ambulatory Visit | Attending: Neurology | Admitting: Neurology

## 2011-03-02 DIAGNOSIS — G629 Polyneuropathy, unspecified: Secondary | ICD-10-CM

## 2011-03-02 DIAGNOSIS — G319 Degenerative disease of nervous system, unspecified: Secondary | ICD-10-CM | POA: Insufficient documentation

## 2011-03-02 DIAGNOSIS — D332 Benign neoplasm of brain, unspecified: Secondary | ICD-10-CM | POA: Insufficient documentation

## 2011-03-02 DIAGNOSIS — E237 Disorder of pituitary gland, unspecified: Secondary | ICD-10-CM | POA: Insufficient documentation

## 2011-03-02 DIAGNOSIS — R413 Other amnesia: Secondary | ICD-10-CM | POA: Insufficient documentation

## 2011-03-02 DIAGNOSIS — G609 Hereditary and idiopathic neuropathy, unspecified: Secondary | ICD-10-CM | POA: Insufficient documentation

## 2011-03-02 LAB — METHYLMALONIC ACID, SERUM: Methylmalonic Acid, Quantitative: 114 nmol/L (ref 87–318)

## 2011-03-02 MED ORDER — GADOBENATE DIMEGLUMINE 529 MG/ML IV SOLN
15.0000 mL | Freq: Once | INTRAVENOUS | Status: AC | PRN
  Administered 2011-03-02: 15 mL via INTRAVENOUS

## 2011-03-07 ENCOUNTER — Telehealth: Payer: Self-pay | Admitting: Neurology

## 2011-03-07 NOTE — Telephone Encounter (Signed)
Called patient.  thx.

## 2011-03-07 NOTE — Telephone Encounter (Signed)
Pt would like the results of her MRI. Will be at home phone number all day.

## 2011-03-20 ENCOUNTER — Ambulatory Visit (INDEPENDENT_AMBULATORY_CARE_PROVIDER_SITE_OTHER): Payer: Medicare Other | Admitting: Internal Medicine

## 2011-03-20 ENCOUNTER — Encounter: Payer: Self-pay | Admitting: Internal Medicine

## 2011-03-20 VITALS — BP 152/90 | HR 77 | Temp 97.2°F | Ht 68.0 in | Wt 155.0 lb

## 2011-03-20 DIAGNOSIS — M25572 Pain in left ankle and joints of left foot: Secondary | ICD-10-CM

## 2011-03-20 DIAGNOSIS — M25561 Pain in right knee: Secondary | ICD-10-CM

## 2011-03-20 DIAGNOSIS — M25579 Pain in unspecified ankle and joints of unspecified foot: Secondary | ICD-10-CM

## 2011-03-20 DIAGNOSIS — M25569 Pain in unspecified knee: Secondary | ICD-10-CM

## 2011-03-20 DIAGNOSIS — J45909 Unspecified asthma, uncomplicated: Secondary | ICD-10-CM

## 2011-03-20 MED ORDER — FLUTICASONE PROPIONATE HFA 44 MCG/ACT IN AERO: 2.0000 | INHALATION_SPRAY | Freq: Two times a day (BID) | RESPIRATORY_TRACT | Status: DC | PRN

## 2011-03-20 NOTE — Assessment & Plan Note (Signed)
Stable symptoms today - refill meds as requested and follow with pulm (young) as needed

## 2011-03-20 NOTE — Patient Instructions (Signed)
It was good to see you today. we'll make referral to Dr. Charlett Blake for your knee pain. Our office will contact you regarding appointment(s) once made. Continue meloxicam as needed for pain until that time

## 2011-03-20 NOTE — Progress Notes (Signed)
  Subjective:    Patient ID: [name], female    DOB: Aug 28, 1940, 70 y.o.   MRN: [id number]  HPI   complains of knee pain, bilateral but R>L Also left ankle pain with swelling over lateral ankle Onset 01/09/11 following accidental trauma/fall and again 01/14/11 New swelling in R knee but no redness or heat Pain is worst in AM, improves as day goes on No fever - no joint pains in hips, wrists, hands or shoulders Improved with otc ibuprofen > tylenol No further falls or trauma  Past Medical History  Diagnosis Date  . Hypothyroid   . Bipolar disease, chronic   . Hyperlipidemia   . Osteoporosis   . Asthma   . GERD (gastroesophageal reflux disease)     Review of Systems  Constitutional: Negative for fever.  Musculoskeletal: Negative for back pain.  Psychiatric/Behavioral: Negative for confusion.       Objective:   Physical Exam  BP 152/90  Pulse 77  Temp(Src) 97.2 F (36.2 C) (Oral)  Ht 5\' 8"  (1.727 m)  Wt 155 lb (70.308 kg)  BMI 23.57 kg/m2  SpO2 97% Constitutional: She is well-developed and well-nourished. No distress. Cardiovascular: Normal rate, regular rhythm and normal heart sounds.  No murmur heard. No BLE edema. Pulmonary/Chest: Effort normal and breath sounds normal. No respiratory distress. She has no wheezes.  Musculoskeletal: mild boggy synovitis B knees, R>L; FROM but tender to palpation on joint line - ligamentous function intact/stable Skin: Skin is warm and dry. No rash noted. No erythema or bruising.  Psychiatric: She has a normal mood and affect. Her behavior is normal. Judgment and thought content normal.   Xray R knee 02/15/11 reviewed: RIGHT KNEE - 1-2 VIEW  Comparison: None.  Findings: Two-view exam shows no fracture. No evidence for  subluxation or dislocation. There is minimal degenerative change  in the patellofemoral compartment. No evidence for joint effusion.  Bone infarct or enchondroma is noted in the distal femur.  IMPRESSION:    No acute bony findings.      Assessment & Plan:   B knee pain - R>L, suspect traumatic flare, no effusions - ligametous fx intact - Recent 01/2011 xray without underlying DJD- improved with meloxicam daily (but stopped due to asthma flare) Will refer to ortho at pt request rather than steroid injection given lack of DJD on 01/2011 xray  L ankle pain - consistent with peroneal tendonitis from same accidental trauma 12/2010 - walking and gait intact -

## 2011-04-12 ENCOUNTER — Ambulatory Visit: Payer: Medicare Other | Admitting: Nurse Practitioner

## 2011-04-12 ENCOUNTER — Ambulatory Visit (INDEPENDENT_AMBULATORY_CARE_PROVIDER_SITE_OTHER): Payer: Medicare Other | Admitting: Nurse Practitioner

## 2011-04-12 ENCOUNTER — Other Ambulatory Visit: Payer: Medicare Other | Admitting: *Deleted

## 2011-04-12 ENCOUNTER — Telehealth: Payer: Self-pay | Admitting: *Deleted

## 2011-04-12 ENCOUNTER — Encounter: Payer: Self-pay | Admitting: Nurse Practitioner

## 2011-04-12 VITALS — BP 140/100 | HR 76 | Ht 67.0 in | Wt 156.1 lb

## 2011-04-12 DIAGNOSIS — E7800 Pure hypercholesterolemia, unspecified: Secondary | ICD-10-CM

## 2011-04-12 DIAGNOSIS — I10 Essential (primary) hypertension: Secondary | ICD-10-CM

## 2011-04-12 DIAGNOSIS — E785 Hyperlipidemia, unspecified: Secondary | ICD-10-CM

## 2011-04-12 LAB — HEPATIC FUNCTION PANEL
ALT: 13 U/L (ref 0–35)
AST: 22 U/L (ref 0–37)
Albumin: 4.3 g/dL (ref 3.5–5.2)
Alkaline Phosphatase: 63 U/L (ref 39–117)
Total Protein: 7.2 g/dL (ref 6.0–8.3)

## 2011-04-12 LAB — BASIC METABOLIC PANEL
CO2: 25 mEq/L (ref 19–32)
Calcium: 9.6 mg/dL (ref 8.4–10.5)
Creatinine, Ser: 0.9 mg/dL (ref 0.4–1.2)
GFR: 69.79 mL/min (ref >60.00)

## 2011-04-12 LAB — LIPID PANEL
Cholesterol: 191 mg/dL (ref 0–200)
Triglycerides: 113 mg/dL (ref 0.0–149.0)

## 2011-04-12 NOTE — Patient Instructions (Signed)
Stay on your current medicines. We are going to get your labs checked today.   Continue to monitor your blood pressure at home and keep a diary.  I want to see you in a month with your blood pressure diary and your cuff.  Call for any problems.

## 2011-04-12 NOTE — Telephone Encounter (Signed)
Message copied by Lorayne Bender on Wed Apr 12, 2011  5:50 PM ------      Message from: [place], PETER M      Created: Wed Apr 12, 2011  5:13 PM       Lipids are OK. Chemistries are normal.      Peter [place]

## 2011-04-12 NOTE — Assessment & Plan Note (Signed)
Blood pressure is up here today and was elevated at her last visit with her PCP. She is fairly adamant that she has good control at home. She is agreeable to keeping a diary over the next month. I will see her back in a month. We will also check her cuff for accuracy. Patient is agreeable to this plan and will call if any problems develop in the interim.

## 2011-04-12 NOTE — Telephone Encounter (Signed)
Notified of lab results. Will send to Dr. Felicity Coyer.

## 2011-04-12 NOTE — Progress Notes (Signed)
Kathyann Britt Boozer Date of Birth: [birth year] Medical Record #[id number]  History of Present Illness: Ms. Surles is seen today for a 6 month check. She is a former patient of Dr. Ronnald Nian. She has been reassigned to Dr. Shirlee Latch. She says she is doing ok. No chest pain or shortness of breath "she thinks". She still appears a little confused and is a poor historian. She has been switched over to Pravachol from Simvastatin due to concerns with her memory. That still seems to be an issue but she says she thinks it is better. She does have bipolar disease. Blood pressure is up today. She says its because she is here. It has previously been well controlled with her psyche medicines. She has seen her PCP recently and blood pressure was up there as well. She says it is ok at home but can't tell me actual readings.   Current Outpatient Prescriptions on File Prior to Visit  Medication Sig Dispense Refill  . cholecalciferol (VITAMIN D) 1000 UNITS tablet Take 1 tablet (1,000 Units total) by mouth daily.      Marland Kitchen EPINEPHrine (EPIPEN JR) 0.15 MG/0.3ML injection Inject 0.15 mg into the muscle as needed.        . fluticasone (FLOVENT HFA) 44 MCG/ACT inhaler Inhale 2 puffs into the lungs 2 (two) times daily as needed.  1 Inhaler  0  . levothyroxine (SYNTHROID, LEVOTHROID) 75 MCG tablet Take 75 mcg by mouth daily.        Marland Kitchen lithium (LITHOBID) 300 MG CR tablet Take 300 mg by mouth 2 (two) times daily.        . mometasone (NASONEX) 50 MCG/ACT nasal spray Place 2 sprays into the nose as needed.        . polyethylene glycol (GLYCOLAX) packet Take 17 g by mouth 3 (three) times a week.       . pravastatin (PRAVACHOL) 20 MG tablet Take 1 tablet (20 mg total) by mouth every evening.  30 tablet  11  . QUEtiapine (SEROQUEL) 25 MG tablet Take 0.5 tablets (12.5 mg total) by mouth at bedtime.      . calcium carbonate (TUMS) 500 MG chewable tablet Chew 2 tablets by mouth at bedtime.         Allergies  Allergen Reactions  .  Lidocaine   . Pneumococcal Vaccines     Past Medical History  Diagnosis Date  . Hypothyroid   . Bipolar disease, chronic   . Hyperlipidemia   . Osteoporosis   . Asthma   . GERD (gastroesophageal reflux disease)   . Elevated blood pressure     Past Surgical History  Procedure Date  . Tonsillectomy   . Right foot and ankle   . Nasal polpectomy 04/1998    Done by Dr. Haroldine Laws  . Right achilles     Done @ [facility] by Dr. Zachery Dakins  . Nasal sinus surgery 10/2000    Right frontal mucocle. Done by Dr. Sherrie Mustache @ [facility]    History  Smoking status  . Passive Smoker  Smokeless tobacco  . Never Used  Comment: husband smokes, but not in the house.    History  Alcohol Use No    Family History  Problem Relation Age of Onset  . Dementia Father   . Breast cancer Maternal Aunt     Review of Systems: The review of systems is per the HPI. She does exercise at the gym three times a week without problems.  All other systems were reviewed  and are negative.  Physical Exam: BP 140/100  Pulse 76  Ht 5\' 7"  (1.702 m)  Wt 156 lb 1.9 oz (70.816 kg)  BMI 24.45 kg/m2 Patient is very pleasant and in no acute distress. Does seem a little confused to me on details. Skin is warm and dry. Color is normal.  HEENT is unremarkable. Normocephalic/atraumatic. PERRL. Sclera are nonicteric. Neck is supple. No masses. No JVD. Lungs are clear. Cardiac exam shows a regular rate and rhythm. Abdomen is soft. Extremities are without edema. Gait and ROM are intact. No gross neurologic deficits noted.   LABORATORY DATA: Pending.    Assessment / Plan:

## 2011-04-12 NOTE — Assessment & Plan Note (Signed)
She is now on Pravachol. We will check her labs today.

## 2011-04-15 NOTE — Progress Notes (Signed)
Agree with note. 

## 2011-04-19 ENCOUNTER — Other Ambulatory Visit: Payer: Self-pay | Admitting: Internal Medicine

## 2011-04-19 ENCOUNTER — Ambulatory Visit (INDEPENDENT_AMBULATORY_CARE_PROVIDER_SITE_OTHER): Payer: Medicare Other

## 2011-04-19 DIAGNOSIS — J309 Allergic rhinitis, unspecified: Secondary | ICD-10-CM

## 2011-04-19 NOTE — Progress Notes (Signed)
Reviewed pt's neuropsych testing done 03/28/2011.  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  Anxiety was felt to be part of the clinical picture but not the sole contributor.  They recommend: - re evaluation in 9-12 months, and sooner if functioning changes - no medication at this time. - consideration of any anti-anxiety medication if it is felt this interferes with daily functioning - compensatory memory strategies  Sent for scanning - 04/20/11

## 2011-05-15 ENCOUNTER — Ambulatory Visit (INDEPENDENT_AMBULATORY_CARE_PROVIDER_SITE_OTHER): Payer: Medicare Other | Admitting: Nurse Practitioner

## 2011-05-15 ENCOUNTER — Encounter: Payer: Self-pay | Admitting: Nurse Practitioner

## 2011-05-15 VITALS — BP 142/96 | HR 80 | Ht 66.0 in | Wt 155.4 lb

## 2011-05-15 DIAGNOSIS — I10 Essential (primary) hypertension: Secondary | ICD-10-CM

## 2011-05-15 DIAGNOSIS — E785 Hyperlipidemia, unspecified: Secondary | ICD-10-CM

## 2011-05-15 NOTE — Progress Notes (Signed)
Caria Britt Boozer Date of Birth: 04-Sep-1940 Medical Record #[id number]  History of Present Illness: [name] is seen today for a one month follow up. She is seen for Dr. Shirlee Latch. She is a former patient of Dr. Ronnald Nian. She has hyperlipidemia and history of elevated blood pressure. She has had a remote history of chest pain and palpitations. She is bipolar. She brings in her list of readings from home which show excellent control. She brought her cuff in to check for correlation. No chest pain. She says she is doing well and feels pretty good. She is not too enthused about the holidays but overall is doing ok.   Current Outpatient Prescriptions on File Prior to Visit  Medication Sig Dispense Refill  . calcium carbonate (TUMS) 500 MG chewable tablet Chew 2 tablets by mouth at bedtime.       . cholecalciferol (VITAMIN D) 1000 UNITS tablet Take 1 tablet (1,000 Units total) by mouth daily.      Marland Kitchen EPINEPHrine (EPIPEN JR) 0.15 MG/0.3ML injection Inject 0.15 mg into the muscle as needed.        . fluticasone (FLOVENT HFA) 44 MCG/ACT inhaler Inhale 2 puffs into the lungs 2 (two) times daily as needed.  1 Inhaler  0  . lithium (LITHOBID) 300 MG CR tablet Take 300 mg by mouth 2 (two) times daily.        . mometasone (NASONEX) 50 MCG/ACT nasal spray Place 2 sprays into the nose as needed.        . polyethylene glycol (GLYCOLAX) packet Take 17 g by mouth 3 (three) times a week.       . pravastatin (PRAVACHOL) 20 MG tablet Take 1 tablet (20 mg total) by mouth every evening.  30 tablet  11  . QUEtiapine (SEROQUEL) 25 MG tablet Take 0.5 tablets (12.5 mg total) by mouth at bedtime.      Marland Kitchen SYNTHROID 75 MCG tablet TAKE 1 TABLET BY MOUTH DAILY  90 tablet  1    Allergies  Allergen Reactions  . Lidocaine   . Pneumococcal Vaccines     Past Medical History  Diagnosis Date  . Hypothyroid   . Bipolar disease, chronic   . Hyperlipidemia   . Osteoporosis   . Asthma   . GERD (gastroesophageal reflux  disease)   . Elevated blood pressure     has good outpatient control.     Past Surgical History  Procedure Date  . Tonsillectomy   . Right foot and ankle   . Nasal polpectomy 04/1998    Done by Dr. Haroldine Laws  . Right achilles     Done @ [facility] by Dr. Zachery Dakins  . Nasal sinus surgery 10/2000    Right frontal mucocle. Done by Dr. Sherrie Mustache @ [facility]    History  Smoking status  . Passive Smoker  Smokeless tobacco  . Never Used  Comment: husband smokes, but not in the house.    History  Alcohol Use No    Family History  Problem Relation Age of Onset  . Dementia Father   . Breast cancer Maternal Aunt     Review of Systems: The review of systems is per the HPI.  All other systems were reviewed and are negative.  Physical Exam: BP 142/96  Pulse 80  Ht 5\' 6"  (1.676 m)  Wt 155 lb 6.4 oz (70.489 kg)  BMI 25.08 kg/m2 Blood pressure by me is 140/80. Her machine reads 147/86.  Patient is very pleasant and in  no acute distress. Skin is warm and dry. Color is normal.  HEENT is unremarkable. Normocephalic/atraumatic. PERRL. Sclera are nonicteric. Neck is supple. No masses. No JVD. Lungs are clear. Cardiac exam shows a regular rate and rhythm. Abdomen is soft. Extremities are without edema. Gait and ROM are intact. No gross neurologic deficits noted.  LABORATORY DATA:   Assessment / Plan:

## 2011-05-15 NOTE — Assessment & Plan Note (Signed)
Will recheck fasting labs in 6 months.

## 2011-05-15 NOTE — Assessment & Plan Note (Signed)
Blood pressure is well controlled at home. She will continue to monitor.

## 2011-05-15 NOTE — Patient Instructions (Signed)
Stay on your current medicines.  Monitor your blood pressure at home.  Record your readings and bring to your next visit. Limit sodium intake.  We will see you in 6 months with fasting labs. You will see Dr. Marca Ancona.  Call the [facility] office at [phone] if you have any questions, problems or concerns.

## 2011-06-27 DIAGNOSIS — Z23 Encounter for immunization: Secondary | ICD-10-CM | POA: Diagnosis not present

## 2011-07-11 ENCOUNTER — Encounter: Payer: Self-pay | Admitting: Internal Medicine

## 2011-07-11 DIAGNOSIS — Z1231 Encounter for screening mammogram for malignant neoplasm of breast: Secondary | ICD-10-CM | POA: Diagnosis not present

## 2011-07-11 DIAGNOSIS — H43399 Other vitreous opacities, unspecified eye: Secondary | ICD-10-CM | POA: Diagnosis not present

## 2011-07-11 LAB — HM MAMMOGRAPHY

## 2011-07-14 ENCOUNTER — Encounter (INDEPENDENT_AMBULATORY_CARE_PROVIDER_SITE_OTHER): Payer: Self-pay | Admitting: Surgery

## 2011-07-14 ENCOUNTER — Encounter: Payer: Self-pay | Admitting: Internal Medicine

## 2011-07-27 DIAGNOSIS — F319 Bipolar disorder, unspecified: Secondary | ICD-10-CM | POA: Diagnosis not present

## 2011-07-28 DIAGNOSIS — M6740 Ganglion, unspecified site: Secondary | ICD-10-CM | POA: Diagnosis not present

## 2011-07-28 DIAGNOSIS — L821 Other seborrheic keratosis: Secondary | ICD-10-CM | POA: Diagnosis not present

## 2011-08-18 ENCOUNTER — Other Ambulatory Visit: Payer: Medicare Other

## 2011-08-21 DIAGNOSIS — F319 Bipolar disorder, unspecified: Secondary | ICD-10-CM | POA: Diagnosis not present

## 2011-08-31 DIAGNOSIS — M1710 Unilateral primary osteoarthritis, unspecified knee: Secondary | ICD-10-CM | POA: Diagnosis not present

## 2011-09-17 IMAGING — CR DG KNEE 1-2V*L*
2 series · 2 of 2 positions shown · non-contrast
Comparison: None.

CLINICAL DATA: Fall.  Bilateral knee pain.

LEFT KNEE - 1-2 VIEW

[view not recorded (1 of 2)]
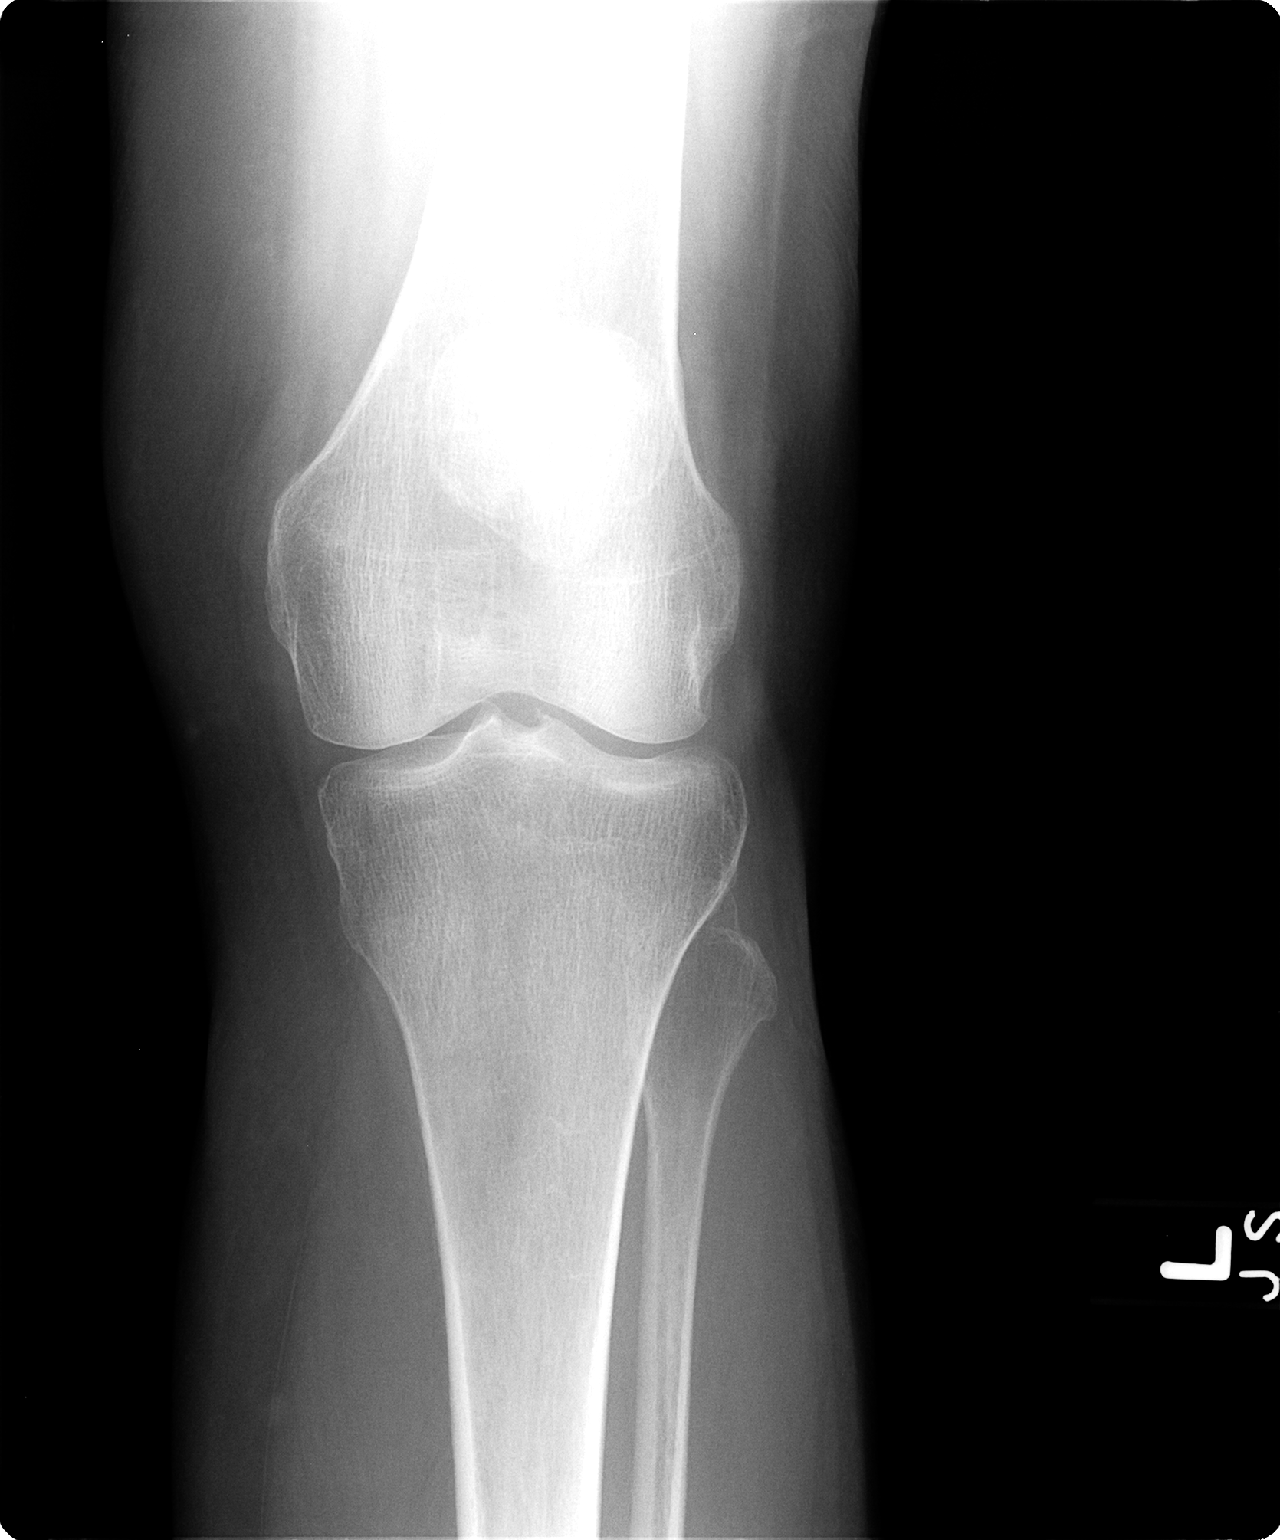

[view not recorded (2 of 2)]
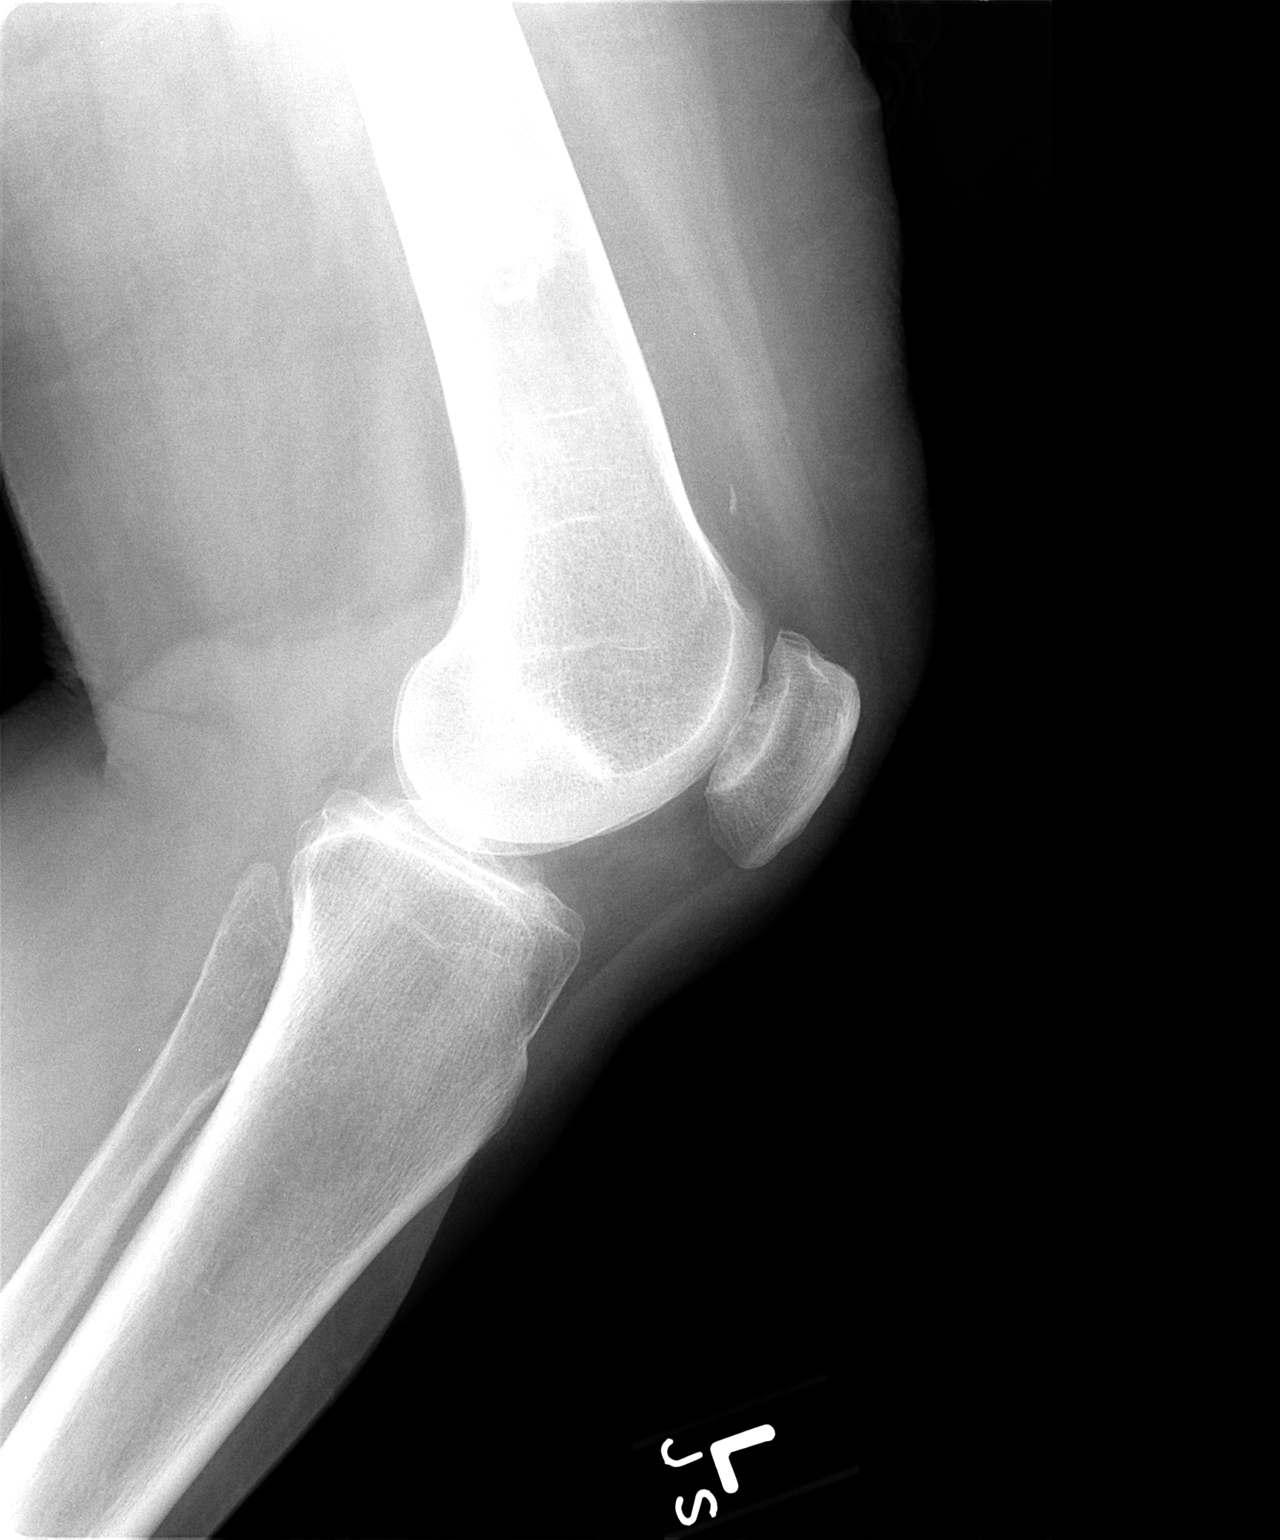

[2 of 2 positions shown; findings below may reference images not displayed]

FINDINGS: There is a densely calcified geographic lesion in the
distal femoral diaphysis, most compatible with benign calcified
enchondroma.  This measures 52 mm long axis on lateral view.  Small
linear calcification is present in the region of the suprapatellar
pouch, which may represent a small osteochondral fragment or soft
tissue calcification.  Chondromalacia patella is present. Medial
and lateral compartment joint spaces are preserved.
IMPRESSION: 1.  No acute osseous abnormality.
2.  Chondromalacia patella.
3.  Small calcification in the region of the suprapatellar pouch
could represent a displaced osteochondral fragment or soft tissue
calcification.
4.  Calcified distal femoral diaphyseal lesion likely represents a
densely calcified enchondroma.

## 2011-09-18 ENCOUNTER — Other Ambulatory Visit (INDEPENDENT_AMBULATORY_CARE_PROVIDER_SITE_OTHER): Payer: Medicare Other

## 2011-09-18 ENCOUNTER — Encounter: Payer: Self-pay | Admitting: Endocrinology

## 2011-09-18 ENCOUNTER — Ambulatory Visit (INDEPENDENT_AMBULATORY_CARE_PROVIDER_SITE_OTHER): Payer: Medicare Other | Admitting: Endocrinology

## 2011-09-18 VITALS — BP 132/84 | HR 79 | Temp 96.9°F | Ht 67.0 in | Wt 160.0 lb

## 2011-09-18 DIAGNOSIS — R1110 Vomiting, unspecified: Secondary | ICD-10-CM

## 2011-09-18 DIAGNOSIS — F1910 Other psychoactive substance abuse, uncomplicated: Secondary | ICD-10-CM

## 2011-09-18 LAB — BASIC METABOLIC PANEL
BUN: 7 mg/dL (ref 6–23)
Calcium: 9.4 mg/dL (ref 8.4–10.5)
Creatinine, Ser: 0.8 mg/dL (ref 0.4–1.2)
GFR: 79.31 mL/min (ref >60.00)
Potassium: 4 mEq/L (ref 3.5–5.1)

## 2011-09-18 LAB — CBC WITH DIFFERENTIAL/PLATELET
Eosinophils Relative: 7.7 % — ABNORMAL HIGH (ref 0.0–5.0)
HCT: 41.3 % (ref 36.0–46.0)
Lymphocytes Relative: 6.3 % — ABNORMAL LOW (ref 12.0–46.0)
Monocytes Relative: 5 % (ref 3.0–12.0)
Neutrophils Relative %: 80.9 % — ABNORMAL HIGH (ref 43.0–77.0)
Platelets: 222 10*3/uL (ref 150.0–400.0)
WBC: 13.1 10*3/uL — ABNORMAL HIGH (ref 4.5–10.5)

## 2011-09-18 LAB — HEPATIC FUNCTION PANEL
ALT: 18 U/L (ref 0–35)
Alkaline Phosphatase: 63 U/L (ref 39–117)
Bilirubin, Direct: 0.1 mg/dL (ref 0.0–0.3)
Total Bilirubin: 0.5 mg/dL (ref 0.3–1.2)

## 2011-09-18 LAB — AMYLASE: Amylase: 90 U/L (ref 27–131)

## 2011-09-18 MED ORDER — ONDANSETRON HCL 4 MG PO TABS: 4.0000 mg | ORAL_TABLET | Freq: Three times a day (TID) | ORAL | Status: AC | PRN

## 2011-09-18 NOTE — Progress Notes (Signed)
Subjective:    Patient ID: [name], female    DOB: 09/29/1940, 71 y.o.   MRN: [id number]  HPI Pt states few weeks of intermittent moderate n/v, in the context of taking laxative.  She has been taking laxatives all her life.  No assoc diarrhea.  She feels the n/v is due to constipation.   Past Medical History  Diagnosis Date  . Hypothyroid   . Bipolar disease, chronic   . Hyperlipidemia   . Osteoporosis   . Asthma   . GERD (gastroesophageal reflux disease)   . Elevated blood pressure     has good outpatient control.   . Normal nuclear stress test 2003    Past Surgical History  Procedure Date  . Tonsillectomy   . Right foot and ankle   . Nasal polpectomy 04/1998    Done by Dr. Haroldine Laws  . Right achilles     Done @ [facility] by Dr. Zachery Dakins  . Nasal sinus surgery 10/2000    Right frontal mucocle. Done by Dr. Sherrie Mustache @ [facility]    History   Social History  . Marital Status: Married    Spouse Name: N/A    Number of Children: N/A  . Years of Education: N/A   Occupational History  . Not on file.   Social History Main Topics  . Smoking status: Passive Smoker  . Smokeless tobacco: Never Used   Comment: husband smokes, but not in the house.  Marland Kitchen Alcohol Use: No  . Drug Use: No  . Sexually Active: Not on file   Other Topics Concern  . Not on file   Social History Narrative  . No narrative on file    Current Outpatient Prescriptions on File Prior to Visit  Medication Sig Dispense Refill  . calcium carbonate (TUMS) 500 MG chewable tablet Chew 2 tablets by mouth at bedtime.       . cholecalciferol (VITAMIN D) 1000 UNITS tablet Take 1 tablet (1,000 Units total) by mouth daily.      . fluticasone (FLOVENT HFA) 44 MCG/ACT inhaler Inhale 2 puffs into the lungs 2 (two) times daily as needed.  1 Inhaler  0  . lithium (LITHOBID) 300 MG CR tablet Take 300 mg by mouth 2 (two) times daily.        . mometasone (NASONEX) 50 MCG/ACT nasal spray Place 2 sprays into the nose as needed.         . polyethylene glycol (GLYCOLAX) packet Take 17 g by mouth daily as needed.       . pravastatin (PRAVACHOL) 20 MG tablet Take 1 tablet (20 mg total) by mouth every evening.  30 tablet  11  . QUEtiapine (SEROQUEL) 25 MG tablet Take 0.5 tablets (12.5 mg total) by mouth at bedtime.      Marland Kitchen SYNTHROID 75 MCG tablet TAKE 1 TABLET BY MOUTH DAILY  90 tablet  1  . EPINEPHrine (EPIPEN JR) 0.15 MG/0.3ML injection Inject 0.15 mg into the muscle as needed.          Allergies  Allergen Reactions  . Lidocaine   . Pneumococcal Vaccines     Family History  Problem Relation Age of Onset  . Dementia Father   . Breast cancer Maternal Aunt     BP 132/84  Pulse 79  Temp(Src) 96.9 F (36.1 C) (Oral)  Ht 5\' 7"  (1.702 m)  Wt 160 lb (72.576 kg)  BMI 25.06 kg/m2  SpO2 91%   Review of Systems Denies brbpr and fever  Objective:   Physical Exam VITAL SIGNS:  See vs page GENERAL: no distress ABDOMEN: abdomen is soft, nontender.  no hepatosplenomegaly.   not distended.  no hernia   Lab Results  Component Value Date   WBC 13.1 Repeated and verified X2.* 09/18/2011   HGB 13.6 09/18/2011   HCT 41.3 09/18/2011   PLT 222.0 09/18/2011   GLUCOSE 101* 09/18/2011   CHOL 191 04/12/2011   TRIG 113.0 04/12/2011   HDL 76.20 04/12/2011   LDLDIRECT 161.3 01/06/2011   LDLCALC 92 04/12/2011   ALT 18 09/18/2011   AST 21 09/18/2011   NA 139 09/18/2011   K 4.0 09/18/2011   CL 107 09/18/2011   CREATININE 0.8 09/18/2011   BUN 7 09/18/2011   CO2 24 09/18/2011   TSH 1.23 02/01/2011   HGBA1C 5.7 02/23/2011      Assessment & Plan:  Chronic laxative abuse.  This places her at risk for several GI probs N/v, new, uncertain etiology

## 2011-09-18 NOTE — Patient Instructions (Addendum)
i have sent a prescription to your pharmacy, to stop the nausea blood tests are being requested for you today.  You will receive a letter with results. Refer back to dr buccini.  you will receive a phone call, about a day and time for an appointment. (see letter)

## 2011-09-19 DIAGNOSIS — F319 Bipolar disorder, unspecified: Secondary | ICD-10-CM | POA: Diagnosis not present

## 2011-09-20 ENCOUNTER — Telehealth: Payer: Self-pay | Admitting: *Deleted

## 2011-09-20 NOTE — Telephone Encounter (Signed)
Called pt to inform of lab results, pt informed. (Letter also mailed to pt)

## 2011-09-25 ENCOUNTER — Other Ambulatory Visit: Payer: Self-pay | Admitting: Internal Medicine

## 2011-10-16 ENCOUNTER — Other Ambulatory Visit: Payer: Medicare Other

## 2011-10-16 ENCOUNTER — Telehealth: Payer: Self-pay | Admitting: Internal Medicine

## 2011-10-16 NOTE — Telephone Encounter (Signed)
Pt. Called me([name]) back at 11:41 and said Dr.Leschber has been okaying her allergy vac.and that's been working fine. So I checked your notes from her last visit(04-14-2010) there's was nothing in your notes to support that. What I found was for her to schedule a follow up appt.in 3 months. In your 01-10-10 notes cont.vac.. It's my understanding that pts.see you for their vac. renewal unless you tell them and me different. Please advise.   I called Mrs. Steffenhagen and left a message on her cell voice mail. I told her that you need to see her for her yearly vac.check-up and if she doesn't keep her appt.we will not be able to send her anymore vac.Marland Kitchen

## 2011-10-17 ENCOUNTER — Telehealth: Payer: Self-pay | Admitting: Internal Medicine

## 2011-10-17 ENCOUNTER — Ambulatory Visit (INDEPENDENT_AMBULATORY_CARE_PROVIDER_SITE_OTHER): Payer: Medicare Other

## 2011-10-17 DIAGNOSIS — J309 Allergic rhinitis, unspecified: Secondary | ICD-10-CM

## 2011-10-17 NOTE — Telephone Encounter (Signed)
Error.[name] ° °

## 2011-10-17 NOTE — Telephone Encounter (Signed)
I need to see patients once per year to maintain allergy vaccine documentation. Dr Felicity Coyer can't do that. Ok to refill vaccine as long as she keeps her May appointment to come in to see me.

## 2011-10-17 NOTE — Telephone Encounter (Signed)
I spoke with the pt and she is aware of refill and that she must keep appt in may. I spoke with Dimas Millin and she is aware and advised that I could sign off on message.Carron Curie, CMA

## 2011-10-17 NOTE — Telephone Encounter (Signed)
LMOMTCB x 1 

## 2011-10-17 NOTE — Telephone Encounter (Signed)
Pt calling again in ref to previous msg.[name] ° ° °

## 2011-10-17 NOTE — Telephone Encounter (Addendum)
Pt returned triage's call.  Pt stated she only has enough vaccination for one more shot.  Pt clarified that Dr. Felicity Coyer only prescribes pt her inhaler, not the allergy vaccine.  Pt stated that she needs an answer & to talk to someone today.   Antionette Fairy

## 2011-10-23 ENCOUNTER — Encounter: Payer: Self-pay | Admitting: Cardiology

## 2011-10-23 ENCOUNTER — Other Ambulatory Visit: Payer: Medicare Other

## 2011-10-23 ENCOUNTER — Ambulatory Visit (INDEPENDENT_AMBULATORY_CARE_PROVIDER_SITE_OTHER): Payer: Medicare Other | Admitting: Cardiology

## 2011-10-23 VITALS — BP 158/92 | HR 70 | Ht 67.0 in | Wt 158.0 lb

## 2011-10-23 DIAGNOSIS — E785 Hyperlipidemia, unspecified: Secondary | ICD-10-CM | POA: Diagnosis not present

## 2011-10-23 DIAGNOSIS — F29 Unspecified psychosis not due to a substance or known physiological condition: Secondary | ICD-10-CM | POA: Diagnosis not present

## 2011-10-23 DIAGNOSIS — E7800 Pure hypercholesterolemia, unspecified: Secondary | ICD-10-CM

## 2011-10-23 DIAGNOSIS — I10 Essential (primary) hypertension: Secondary | ICD-10-CM

## 2011-10-23 DIAGNOSIS — R410 Disorientation, unspecified: Secondary | ICD-10-CM

## 2011-10-23 LAB — LIPID PANEL
HDL: 73.7 mg/dL (ref >39.00)
LDL Cholesterol: 88 mg/dL (ref 0–99)
Total CHOL/HDL Ratio: 2
Triglycerides: 87 mg/dL (ref 0.0–149.0)

## 2011-10-23 LAB — HEPATIC FUNCTION PANEL
Albumin: 4.3 g/dL (ref 3.5–5.2)
Alkaline Phosphatase: 64 U/L (ref 39–117)
Total Bilirubin: 0.5 mg/dL (ref 0.3–1.2)

## 2011-10-23 NOTE — Assessment & Plan Note (Signed)
Check lipids/LFTs.  

## 2011-10-23 NOTE — Patient Instructions (Addendum)
Take and record your blood pressure daily. I will call you in about 2 weeks and get the readings. Luana Shu [phone].  Your physician recommends that you have a FASTING lipid profile /liver profile today.   Your physician wants you to follow-up in: 1 year with Dr Shirlee Latch. (April 2014). You will receive a reminder letter in the mail two months in advance. If you don't receive a letter, please call our office to schedule the follow-up appointment.   Call Dr Modesto Charon and schedule an appointment with him to follow-up on your memory--[phone]

## 2011-10-23 NOTE — Assessment & Plan Note (Signed)
BP running high today.  She will check her BP daily x 2 wks and write the numbers down.  We will call in 2 wks to see what BP has done.

## 2011-10-23 NOTE — Assessment & Plan Note (Signed)
Ongoing periodic memory difficulty and confusion.  Needs to follow back with Dr. Modesto Charon.

## 2011-10-23 NOTE — Progress Notes (Signed)
PCP: Dr. Felicity Coyer  71 yo with history of hyperlipidemia, HTN presents for cardiology followup.  She has been seen by Dr. Deborah Chalk in the past and is seen by me for the first time today.  She has been doing well symptomatically.  No chest pain or exertional dyspnea.  She works out at [employer] 3 times a week.  She continues to have problems with memory.  She was seen by Dr. Modesto Charon with ongoing workup . BP is running high in the office today.  She has not checked it recently at home.    ECG: NSR, normal  Labs (10/12): LDL 92, HDL 76 Labs (3/13): K 4, creatinine 0.8  PMH: 1. HTN: Not on meds 2. Hyperlipidemia 3. Memory difficulty 4. Hypothyroidism 5. Bipolar disorder 6. H/o eosinophilic PNA 7. H/o chest pain: prior normal stress test.  8. H/o palpitations  SH: Married with 2 children.  Lives in [place].  Nonsmoker.   FH: Dementia.  No premature CAD.   ROS: All systems reviewed and negative except as per HPI.   Current Outpatient Prescriptions  Medication Sig Dispense Refill  . ALPRAZolam (XANAX) 0.25 MG tablet 1/2 tablet by mouth twice daily      . calcium carbonate (TUMS) 500 MG chewable tablet Chew 2 tablets by mouth at bedtime.       . cholecalciferol (VITAMIN D) 1000 UNITS tablet Take 1 tablet (1,000 Units total) by mouth daily.      Marland Kitchen EPINEPHrine (EPIPEN JR) 0.15 MG/0.3ML injection Inject 0.15 mg into the muscle as needed.        . fluticasone (FLOVENT HFA) 44 MCG/ACT inhaler Inhale 2 puffs into the lungs 2 (two) times daily as needed.  1 Inhaler  0  . lithium (LITHOBID) 300 MG CR tablet Take 300 mg by mouth 2 (two) times daily.        . mometasone (NASONEX) 50 MCG/ACT nasal spray Place 2 sprays into the nose as needed.        . polyethylene glycol (GLYCOLAX) packet Take 17 g by mouth daily as needed.       . pravastatin (PRAVACHOL) 20 MG tablet Take 1 tablet (20 mg total) by mouth every evening.  30 tablet  11  . QUEtiapine (SEROQUEL) 25 MG tablet Take 0.5 tablets (12.5 mg  total) by mouth at bedtime.      Marland Kitchen SYNTHROID 75 MCG tablet TAKE 1 TABLET BY MOUTH DAILY  90 tablet  1    BP 158/92  Pulse 70  Ht 5\' 7"  (1.702 m)  Wt 158 lb (71.668 kg)  BMI 24.75 kg/m2 General: NAD Neck: No JVD, no thyromegaly or thyroid nodule.  Lungs: Clear to auscultation bilaterally with normal respiratory effort. CV: Nondisplaced PMI.  Heart regular S1/S2, no S3/S4, no murmur.  No peripheral edema.  No carotid bruit.  Normal pedal pulses.  Abdomen: Soft, nontender, no hepatosplenomegaly, no distention.   Neurologic: Alert and oriented x 3.  Psych: Normal affect. Extremities: No clubbing or cyanosis.

## 2011-10-27 ENCOUNTER — Encounter: Payer: Self-pay | Admitting: *Deleted

## 2011-11-07 ENCOUNTER — Telehealth: Payer: Self-pay | Admitting: *Deleted

## 2011-11-07 NOTE — Telephone Encounter (Signed)
Hypertension - Marca Ancona, MD 10/23/2011 11:10 PM Signed  BP running high today. She will check her BP daily x 2 wks and write the numbers down. We will call in 2 wks to see what BP has done.   11/07/11--I called pt to get record of recent BP readings. Spoke with pt. She did not have BP readings with her. She states she will call back later today or tomorrow with the readings.

## 2011-11-08 NOTE — Telephone Encounter (Signed)
Fu call °Pt returning your call  °

## 2011-11-08 NOTE — Telephone Encounter (Signed)
Patient  States  that she has been taken her B/P for 2 weeks already. 10/24/11- B/P 137/80,  5/01- 132/75,  05/02 -137/80,   07/03- 130/79,  05/04 -132/77,  05/05- 125/77, 05/06- 117/74,   05/07 -132/76, 05/08- 120/80 05/09- 135/79,  05/10- 123/70,  05/11- 118/77, 05/12-128/77  05/14-123/76.

## 2011-11-09 DIAGNOSIS — F319 Bipolar disorder, unspecified: Secondary | ICD-10-CM | POA: Diagnosis not present

## 2011-11-09 NOTE — Telephone Encounter (Signed)
BP looks good, no changes to meds.  ----- Message ----- From: Jacqlyn Krauss, RN Sent: 11/09/2011 7:33 AM To: Laurey Morale, MD  11/09/11 pt notified.

## 2011-11-23 ENCOUNTER — Ambulatory Visit (INDEPENDENT_AMBULATORY_CARE_PROVIDER_SITE_OTHER): Payer: Medicare Other | Admitting: Internal Medicine

## 2011-11-23 ENCOUNTER — Encounter: Payer: Self-pay | Admitting: Internal Medicine

## 2011-11-23 ENCOUNTER — Ambulatory Visit (INDEPENDENT_AMBULATORY_CARE_PROVIDER_SITE_OTHER)
Admission: RE | Admit: 2011-11-23 | Discharge: 2011-11-23 | Disposition: A | Discharge status: Home / Self Care | Payer: Medicare Other | Source: Ambulatory Visit | Attending: Internal Medicine | Admitting: Internal Medicine

## 2011-11-23 VITALS — BP 120/76 | HR 87 | Ht 66.0 in | Wt 158.2 lb

## 2011-11-23 DIAGNOSIS — J45909 Unspecified asthma, uncomplicated: Secondary | ICD-10-CM

## 2011-11-23 DIAGNOSIS — J45998 Other asthma: Secondary | ICD-10-CM

## 2011-11-23 DIAGNOSIS — J984 Other disorders of lung: Secondary | ICD-10-CM | POA: Diagnosis not present

## 2011-11-23 DIAGNOSIS — R0789 Other chest pain: Secondary | ICD-10-CM | POA: Diagnosis not present

## 2011-11-23 MED ORDER — ALBUTEROL SULFATE HFA 108 (90 BASE) MCG/ACT IN AERS: INHALATION_SPRAY | RESPIRATORY_TRACT | Status: DC

## 2011-11-23 MED ORDER — FLUTICASONE PROPIONATE HFA 44 MCG/ACT IN AERO: INHALATION_SPRAY | RESPIRATORY_TRACT | Status: DC

## 2011-11-23 MED ORDER — EPINEPHRINE 0.3 MG/0.3ML IJ DEVI: 0.3000 mg | Freq: Once | INTRAMUSCULAR | Status: DC

## 2011-11-23 NOTE — Patient Instructions (Signed)
Scripts sent for epipen, Flovent 44 and your albuterol rescue inhaler  Please call as needed  Order CXR-  [facility]

## 2011-11-23 NOTE — Progress Notes (Signed)
11/23/11- 71 yoF passive smoker followed for asthma, allergy complicated by bipolar disorder, hypothyroid, HBP LOV-04/14/10 Remote history of eosinophilic pneumonia. Allergy vaccine still helps. She needs EpiPen refill. Infrequent need for rescue inhaler. Feeling very well now. Clinical Data: Cough with left lower lobe nodules on chest radiograph. 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  CT OF THE CHEST WITH CONTRAST: 09/28/04 Multidetector helical CT imaging was performed during the IV bolus injection of 100 cc Omnipaque 300. There are no chest radiographs available for correlation.  The lungs are clear without evidence of pulmonary nodule. There is no endobronchial lesion or confluent air space opacity. There is no pleural or pericardial effusion. No enlarged mediastinal or hilar lymph nodes are present. The visualized upper abdomen is unremarkable.  IMPRESSION:  Normal CT of the chest. No evidence of pulmonary nodule.  jst  Provider: Hinda Kehr, Niel Hummer   ROS-see HPI Constitutional:   No-  weight loss, night sweats, fevers, chills, fatigue, lassitude. HEENT:   No-  headaches, difficulty swallowing, tooth/dental problems, sore throat,       No-  sneezing, itching, ear ache, nasal congestion, post nasal drip,  CV:  No-   chest pain, orthopnea, PND, swelling in lower extremities, anasarca, dizziness, palpitations Resp: No-   shortness of breath with exertion or at rest.              No-   productive cough,  No non-productive cough,  No- coughing up of blood.              No-   change in color of mucus.  No- wheezing.   Skin: No-   rash or lesions. GI:  No-   heartburn, indigestion, abdominal pain, nausea, vomiting, GU: . MS:  No-   joint pain or swelling.   Neuro-     nothing unusual Psych:  No- change in mood or affect. No depression or anxiety.  No memory loss.  OBJ- Physical Exam General- Alert, Oriented, Affect-appropriate, Distress- none acute, talkative Skin-  rash-none, lesions- none, excoriation- none Lymphadenopathy- none Head- atraumatic            Eyes- Gross vision intact, PERRLA, conjunctivae and secretions clear            Ears- Hearing, canals-normal            Nose- Clear, no-Septal dev, mucus, polyps, erosion, perforation             Throat- Mallampati II , mucosa clear , drainage- none, tonsils- atrophic Neck- flexible , trachea midline, no stridor , thyroid nl, carotid no bruit Chest - symmetrical excursion , unlabored           Heart/CV- RRR , no murmur , no gallop  , no rub, nl s1 s2                           - JVD- none , edema- none, stasis changes- none, varices- none           Lung- clear to P&A, wheeze- none, cough- none , dullness-none, rub- none           Chest wall-  Abd-  Br/ Gen/ Rectal- Not done, not indicated Extrem- cyanosis- none, clubbing, none, atrophy- none, strength- nl Neuro- grossly intact to observation

## 2011-11-27 ENCOUNTER — Encounter: Payer: Self-pay | Admitting: Internal Medicine

## 2011-11-27 NOTE — Assessment & Plan Note (Addendum)
Continues Flovent 44 maintenance inhaler with rare need for her rescue inhaler. Does need refills. Medication discussion done. She is satisfied that allergy vaccine helps. We discussed continuation, EpiPen. Plan-chest x-ray

## 2011-11-28 NOTE — Progress Notes (Signed)
Quick Note:  LMTCB ______ 

## 2011-11-30 NOTE — Progress Notes (Signed)
Quick Note:  Pt aware of results. ______ 

## 2011-12-12 ENCOUNTER — Telehealth: Payer: Self-pay | Admitting: *Deleted

## 2011-12-12 NOTE — Telephone Encounter (Signed)
Pt is asking for MD's suggestion on where she can go to have her next mammogram done next January. She was very unhappy with the service she received at [facility] on [address]

## 2011-12-12 NOTE — Telephone Encounter (Signed)
Pt informed of MD's suggestion.

## 2011-12-12 NOTE — Telephone Encounter (Signed)
[facility] - pt can choose and remind me in Jan - thanks

## 2011-12-18 DIAGNOSIS — M810 Age-related osteoporosis without current pathological fracture: Secondary | ICD-10-CM | POA: Diagnosis not present

## 2011-12-18 DIAGNOSIS — F329 Major depressive disorder, single episode, unspecified: Secondary | ICD-10-CM | POA: Diagnosis not present

## 2011-12-18 DIAGNOSIS — M8900 Algoneurodystrophy, unspecified site: Secondary | ICD-10-CM | POA: Diagnosis not present

## 2011-12-18 DIAGNOSIS — J45909 Unspecified asthma, uncomplicated: Secondary | ICD-10-CM | POA: Diagnosis not present

## 2011-12-21 ENCOUNTER — Ambulatory Visit (INDEPENDENT_AMBULATORY_CARE_PROVIDER_SITE_OTHER): Payer: Medicare Other | Admitting: Internal Medicine

## 2011-12-21 ENCOUNTER — Encounter: Payer: Self-pay | Admitting: Internal Medicine

## 2011-12-21 VITALS — BP 130/84 | HR 78 | Temp 98.5°F | Ht 66.0 in | Wt 156.0 lb

## 2011-12-21 DIAGNOSIS — E785 Hyperlipidemia, unspecified: Secondary | ICD-10-CM

## 2011-12-21 DIAGNOSIS — D239 Other benign neoplasm of skin, unspecified: Secondary | ICD-10-CM

## 2011-12-21 DIAGNOSIS — E039 Hypothyroidism, unspecified: Secondary | ICD-10-CM | POA: Diagnosis not present

## 2011-12-21 DIAGNOSIS — D229 Melanocytic nevi, unspecified: Secondary | ICD-10-CM

## 2011-12-21 NOTE — Assessment & Plan Note (Signed)
Lab Results  Component Value Date   TSH 1.23 02/01/2011  check lab and adjust as needed The current medical regimen is effective;  continue present plan and medications.  

## 2011-12-21 NOTE — Assessment & Plan Note (Signed)
On prava - recent lipids reviewed Follows with cards for same The current medical regimen is effective;  continue present plan and medications.  

## 2011-12-21 NOTE — Patient Instructions (Addendum)
It was good to see you today. Test(s) ordered today. Your results will be called to you after review (48-72hours after test completion). If any changes need to be made, you will be notified at that time. we'll make referral to dermatology for your mole. Our office will contact you regarding appointment(s) once made. Medications reviewed, no changes at this time. Will refer to [facility] in Jan 2014 when your mammogram is due Please schedule followup in 6-12 months, call sooner if problems.

## 2011-12-21 NOTE — Progress Notes (Signed)
  Subjective:    Patient ID: [name], female    DOB: 09-26-1940, 71 y.o.   MRN: [id number]  HPI  Concerned about growing skin spot on anterior left chest  Denies bleeding, redness or pain No color changes  also reviewed other medical concerns Requests different breast imaging center ([facility] not [facility]) in 06/2012 when due  Past Medical History  Diagnosis Date  . Hypothyroid   . Bipolar disease, chronic   . Hyperlipidemia   . Osteoporosis   . Asthma   . GERD (gastroesophageal reflux disease)   . Elevated blood pressure     has good outpatient control.   . Normal nuclear stress test 2003    Review of Systems  Constitutional: Negative for fever and fatigue.  Musculoskeletal: Negative for back pain.  Psychiatric/Behavioral: Negative for confusion.       Objective:   Physical Exam  BP 130/84  Pulse 78  Temp 98.5 F (36.9 C) (Oral)  Ht 5\' 6"  (1.676 m)  Wt 156 lb (70.761 kg)  BMI 25.18 kg/m2  SpO2 93% Wt Readings from Last 3 Encounters:  12/21/11 156 lb (70.761 kg)  11/23/11 158 lb 3.2 oz (71.759 kg)  10/23/11 158 lb (71.668 kg)   Constitutional: She is well-developed and well-nourished. No distress. Cardiovascular: Normal rate, regular rhythm and normal heart sounds.  No murmur heard. No BLE edema. Pulmonary/Chest: Effort normal and breath sounds normal. No respiratory distress. She has no wheezes.  Skin: SK on left anterior chest - not inflamed - other skin is warm and dry. No rash noted. No erythema or bruising.  Psychiatric: She has a normal mood and affect. Her behavior is normal. Judgment and thought content normal but repetitive.   Lab Results  Component Value Date   WBC 13.1 Repeated and verified X2.* 09/18/2011   HGB 13.6 09/18/2011   HCT 41.3 09/18/2011   PLT 222.0 09/18/2011   GLUCOSE 101* 09/18/2011   CHOL 179 10/23/2011   TRIG 87.0 10/23/2011   HDL 73.70 10/23/2011   LDLDIRECT 161.3 01/06/2011   LDLCALC 88 10/23/2011   ALT 13 10/23/2011   AST 20  10/23/2011   NA 139 09/18/2011   K 4.0 09/18/2011   CL 107 09/18/2011   CREATININE 0.8 09/18/2011   BUN 7 09/18/2011   CO2 24 09/18/2011   TSH 1.23 02/01/2011   HGBA1C 5.7 02/23/2011       Assessment & Plan:   SK, ?change mole per pt - will refer to derm at pt request

## 2011-12-29 ENCOUNTER — Other Ambulatory Visit: Payer: Self-pay | Admitting: *Deleted

## 2011-12-29 MED ORDER — PRAVASTATIN SODIUM 20 MG PO TABS: 20.0000 mg | ORAL_TABLET | Freq: Every evening | ORAL | Status: DC

## 2011-12-29 NOTE — Telephone Encounter (Signed)
Refilled pravastatin. 

## 2011-12-30 ENCOUNTER — Other Ambulatory Visit: Payer: Self-pay | Admitting: Nurse Practitioner

## 2012-01-02 ENCOUNTER — Ambulatory Visit (INDEPENDENT_AMBULATORY_CARE_PROVIDER_SITE_OTHER): Payer: Medicare Other | Admitting: Neurology

## 2012-01-02 ENCOUNTER — Encounter: Payer: Self-pay | Admitting: Neurology

## 2012-01-02 VITALS — BP 144/80 | HR 80 | Wt 154.0 lb

## 2012-01-02 DIAGNOSIS — G3184 Mild cognitive impairment, so stated: Secondary | ICD-10-CM

## 2012-01-02 NOTE — Progress Notes (Signed)
Dear Dr. 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  As you may recall, she is a 71 y.o. year old female with a history of bipolar disorder who had had 3 falls when I saw her last and was having some problems with memory.  I sent her for NP testing and they felt she had MCI with dysexecutive and amnestic features.  CT head did not show significant atrophy.  She says her memory has improved since stopping simvastatin and switching to pravastatin.  She continues to pay the bills at home and do the shopping and cooking.  As for her falls, I felt that a peripheral neuropathy may have contributed.  PN lab testing was unremarkable.  She has not had any more falls, and continues to ball room dance.  Her bipolar disorder remains controlled.  Medical history, social history, and family history were reviewed and have not changed since the last clinic visit.  Current Outpatient Prescriptions on File Prior to Visit  Medication Sig Dispense Refill  . albuterol (PROAIR HFA) 108 (90 BASE) MCG/ACT inhaler 2 puffs every 4 hours as needed- rescue inhaler  2 Inhaler  prn  . ALPRAZolam (XANAX) 0.25 MG tablet Take 1/4 bid      . calcium carbonate (TUMS) 500 MG chewable tablet Chew 2 tablets by mouth at bedtime.       . cholecalciferol (VITAMIN D) 1000 UNITS tablet Take 1 tablet (1,000 Units total) by mouth daily.      Marland Kitchen EPINEPHrine (EPIPEN) 0.3 mg/0.3 mL DEVI Inject 0.3 mLs (0.3 mg total) into the muscle once.  1 Device  prn  . fluticasone (FLOVENT HFA) 44 MCG/ACT inhaler 2 puffs then rinse, once or twice daily  1 Inhaler  0  . lithium (LITHOBID) 300 MG CR tablet Take 300 mg by mouth 2 (two) times daily.        . mometasone (NASONEX) 50 MCG/ACT nasal spray Place 2 sprays into the nose as needed.        . polyethylene glycol (GLYCOLAX) packet Take 17 g by mouth  daily as needed.       . pravastatin (PRAVACHOL) 20 MG tablet TAKE 1 TABLET (20 MG TOTAL) BY MOUTH EVERY EVENING.  30 tablet  11  . QUEtiapine (SEROQUEL) 25 MG tablet Take 0.5 tablets (12.5 mg total) by mouth at bedtime.      Marland Kitchen SYNTHROID 75 MCG tablet TAKE 1 TABLET BY MOUTH DAILY  90 tablet  1  . DISCONTD: pravastatin (PRAVACHOL) 20 MG tablet Take 1 tablet (20 mg total) by mouth every evening.  90 tablet  3    Allergies  Allergen Reactions  . Lidocaine   . Pneumococcal Vaccines     ROS:  13 systems were reviewed and  are unremarkable.  Exam: . Filed Vitals:   01/02/12 1131  BP: 144/80  Pulse: 80  Weight: 154 lb (69.854 kg)    In general, well appearing older women.h  Mental status:   MMSE 24/27 1/3 3 word recall, 4/5 time  Cranial Nerves: Pupils are equally round and reactive to light. Visual fields full to confrontation. Extraocular movements are intact without nystagmus. Facial sensation and muscles of mastication are intact. Muscles of facial expression are symmetric. Tongue protrusion, uvula, palate midline.  Shoulder shrug intact  Motor:  Normal bulk and tone, no drift and 5/5 muscle strength bilaterally.  Reflexes:  3+ UE, 2+ LE, except absent ankles, toes down. Sensation: absent vibration and intact position at toes, length dep loss of temperature in feet.  Coordination:  Normal finger to nose  Gait:  Normal gait and station.  Romberg negative.  Impression/Recommendations:  1.  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  If there are changes I would suggest a reevaluation at [facility] neuropsych as they did the evaluation before.  At this time I would not make any changes.  It is possible that simvastatin contributed as this has been reported. 2.  Falls - She is certainly doing ok as she continues to ballroom dance.  Mild PN on exam.  No etiology.  I don't think there is a need for NCS at this time. 3.  Brisk reflexes - her reflexes in her arms are  brisk.  However, I would not pursue this further unless her gait changes which could possibly invoke a cervical myelopathy.  If it does change then an MRI of her C-spine would be necessary.  She can follow up with yourself in primary care  Lupita Raider. Modesto Charon, MD [facility], [place]

## 2012-01-03 ENCOUNTER — Other Ambulatory Visit: Payer: Self-pay | Admitting: *Deleted

## 2012-01-03 MED ORDER — PRAVASTATIN SODIUM 20 MG PO TABS: 20.0000 mg | ORAL_TABLET | Freq: Every day | ORAL | Status: DC

## 2012-01-19 ENCOUNTER — Telehealth: Payer: Self-pay | Admitting: Cardiovascular Disease

## 2012-01-19 NOTE — Telephone Encounter (Signed)
Pravastatin 90 day supply refill needed, [facility]

## 2012-01-19 NOTE — Telephone Encounter (Signed)
Called pharmacy to verify 90 day rx for pravastatin. The rx is in the system and i notified pt of this. She will pick this up tomorrow and expressed she prefers 90 rx    Meshach Perry CMA

## 2012-02-02 ENCOUNTER — Telehealth: Payer: Self-pay | Admitting: *Deleted

## 2012-02-02 DIAGNOSIS — K068 Other specified disorders of gingiva and edentulous alveolar ridge: Secondary | ICD-10-CM

## 2012-02-02 NOTE — Telephone Encounter (Signed)
Called pt no answer LMOM md ok lab order already in comp just go to [facility] to have done... 02/02/12@2 :40pm/LMB

## 2012-02-02 NOTE — Telephone Encounter (Signed)
Left msg on vm stating needing to have some major dental work done, but dentist is wanting her Platelet level check. Wanting to have labs done here...02/02/12@12 :03pm/LMB

## 2012-02-02 NOTE — Telephone Encounter (Signed)
Cbc ordered

## 2012-02-05 ENCOUNTER — Other Ambulatory Visit (INDEPENDENT_AMBULATORY_CARE_PROVIDER_SITE_OTHER): Payer: Medicare Other

## 2012-02-05 DIAGNOSIS — K055 Other periodontal diseases: Secondary | ICD-10-CM | POA: Diagnosis not present

## 2012-02-05 DIAGNOSIS — K068 Other specified disorders of gingiva and edentulous alveolar ridge: Secondary | ICD-10-CM

## 2012-02-05 LAB — CBC WITH DIFFERENTIAL/PLATELET
Basophils Absolute: 0.1 10*3/uL (ref 0.0–0.1)
HCT: 38.5 % (ref 36.0–46.0)
Hemoglobin: 12.7 g/dL (ref 12.0–15.0)
Lymphs Abs: 1.4 10*3/uL (ref 0.7–4.0)
MCHC: 33 g/dL (ref 30.0–36.0)
MCV: 90.4 fl (ref 78.0–100.0)
Monocytes Absolute: 0.6 10*3/uL (ref 0.1–1.0)
Monocytes Relative: 7 % (ref 3.0–12.0)
Neutro Abs: 5.5 10*3/uL (ref 1.4–7.7)
Platelets: 216 10*3/uL (ref 150.0–400.0)
RDW: 13.9 % (ref 11.5–14.6)

## 2012-02-15 DIAGNOSIS — F319 Bipolar disorder, unspecified: Secondary | ICD-10-CM | POA: Diagnosis not present

## 2012-02-20 ENCOUNTER — Other Ambulatory Visit: Payer: Self-pay | Admitting: Dermatology

## 2012-02-20 DIAGNOSIS — D239 Other benign neoplasm of skin, unspecified: Secondary | ICD-10-CM | POA: Diagnosis not present

## 2012-02-20 DIAGNOSIS — D1801 Hemangioma of skin and subcutaneous tissue: Secondary | ICD-10-CM | POA: Diagnosis not present

## 2012-02-20 DIAGNOSIS — D485 Neoplasm of uncertain behavior of skin: Secondary | ICD-10-CM | POA: Diagnosis not present

## 2012-02-22 DIAGNOSIS — Z79899 Other long term (current) drug therapy: Secondary | ICD-10-CM | POA: Diagnosis not present

## 2012-02-22 DIAGNOSIS — F3112 Bipolar disorder, current episode manic without psychotic features, moderate: Secondary | ICD-10-CM | POA: Diagnosis not present

## 2012-02-27 DIAGNOSIS — D485 Neoplasm of uncertain behavior of skin: Secondary | ICD-10-CM | POA: Diagnosis not present

## 2012-02-27 DIAGNOSIS — D2370 Other benign neoplasm of skin of unspecified lower limb, including hip: Secondary | ICD-10-CM | POA: Diagnosis not present

## 2012-02-27 DIAGNOSIS — Z85828 Personal history of other malignant neoplasm of skin: Secondary | ICD-10-CM | POA: Diagnosis not present

## 2012-02-27 DIAGNOSIS — L821 Other seborrheic keratosis: Secondary | ICD-10-CM | POA: Diagnosis not present

## 2012-02-27 DIAGNOSIS — D239 Other benign neoplasm of skin, unspecified: Secondary | ICD-10-CM | POA: Diagnosis not present

## 2012-02-27 DIAGNOSIS — L570 Actinic keratosis: Secondary | ICD-10-CM | POA: Diagnosis not present

## 2012-02-29 DIAGNOSIS — M79609 Pain in unspecified limb: Secondary | ICD-10-CM | POA: Diagnosis not present

## 2012-03-11 ENCOUNTER — Telehealth: Payer: Self-pay | Admitting: General Practice

## 2012-03-11 DIAGNOSIS — Z124 Encounter for screening for malignant neoplasm of cervix: Secondary | ICD-10-CM

## 2012-03-11 NOTE — Telephone Encounter (Signed)
I will make new referral and ask [facility] to arrange new obgyn who works with her insurance  thanks

## 2012-03-11 NOTE — Telephone Encounter (Signed)
Pt stated that she tried to see her OBGYN, Dr. Thamas Jaegers at [facility], and was told they no longer accept her insurance [employer] and original [employer]. Do you now of a provider that she could switch to? Please advise. Thanks.

## 2012-03-18 DIAGNOSIS — Z1212 Encounter for screening for malignant neoplasm of rectum: Secondary | ICD-10-CM | POA: Diagnosis not present

## 2012-03-18 DIAGNOSIS — Z9189 Other specified personal risk factors, not elsewhere classified: Secondary | ICD-10-CM | POA: Diagnosis not present

## 2012-03-19 ENCOUNTER — Other Ambulatory Visit: Payer: Self-pay | Admitting: Internal Medicine

## 2012-03-19 DIAGNOSIS — I8310 Varicose veins of unspecified lower extremity with inflammation: Secondary | ICD-10-CM | POA: Diagnosis not present

## 2012-04-09 DIAGNOSIS — M79609 Pain in unspecified limb: Secondary | ICD-10-CM | POA: Diagnosis not present

## 2012-04-09 DIAGNOSIS — I8310 Varicose veins of unspecified lower extremity with inflammation: Secondary | ICD-10-CM | POA: Diagnosis not present

## 2012-04-23 DIAGNOSIS — M79609 Pain in unspecified limb: Secondary | ICD-10-CM | POA: Diagnosis not present

## 2012-04-23 DIAGNOSIS — I8310 Varicose veins of unspecified lower extremity with inflammation: Secondary | ICD-10-CM | POA: Diagnosis not present

## 2012-04-23 DIAGNOSIS — M7981 Nontraumatic hematoma of soft tissue: Secondary | ICD-10-CM | POA: Diagnosis not present

## 2012-04-24 DIAGNOSIS — Z23 Encounter for immunization: Secondary | ICD-10-CM | POA: Diagnosis not present

## 2012-04-26 ENCOUNTER — Ambulatory Visit (INDEPENDENT_AMBULATORY_CARE_PROVIDER_SITE_OTHER): Payer: Medicare Other

## 2012-04-26 DIAGNOSIS — J309 Allergic rhinitis, unspecified: Secondary | ICD-10-CM | POA: Diagnosis not present

## 2012-05-07 DIAGNOSIS — H2510 Age-related nuclear cataract, unspecified eye: Secondary | ICD-10-CM | POA: Diagnosis not present

## 2012-05-08 DIAGNOSIS — I8310 Varicose veins of unspecified lower extremity with inflammation: Secondary | ICD-10-CM | POA: Diagnosis not present

## 2012-05-08 DIAGNOSIS — M79609 Pain in unspecified limb: Secondary | ICD-10-CM | POA: Diagnosis not present

## 2012-05-08 DIAGNOSIS — M7981 Nontraumatic hematoma of soft tissue: Secondary | ICD-10-CM | POA: Diagnosis not present

## 2012-06-11 DIAGNOSIS — F319 Bipolar disorder, unspecified: Secondary | ICD-10-CM | POA: Diagnosis not present

## 2012-06-21 ENCOUNTER — Encounter: Payer: Self-pay | Admitting: Internal Medicine

## 2012-06-21 ENCOUNTER — Ambulatory Visit (INDEPENDENT_AMBULATORY_CARE_PROVIDER_SITE_OTHER): Payer: Medicare Other | Admitting: Internal Medicine

## 2012-06-21 ENCOUNTER — Other Ambulatory Visit (INDEPENDENT_AMBULATORY_CARE_PROVIDER_SITE_OTHER): Payer: Medicare Other

## 2012-06-21 VITALS — BP 122/80 | HR 73 | Temp 97.7°F | Ht 66.0 in | Wt 156.1 lb

## 2012-06-21 DIAGNOSIS — E039 Hypothyroidism, unspecified: Secondary | ICD-10-CM

## 2012-06-21 DIAGNOSIS — F3112 Bipolar disorder, current episode manic without psychotic features, moderate: Secondary | ICD-10-CM | POA: Diagnosis not present

## 2012-06-21 DIAGNOSIS — E785 Hyperlipidemia, unspecified: Secondary | ICD-10-CM | POA: Diagnosis not present

## 2012-06-21 DIAGNOSIS — Z79899 Other long term (current) drug therapy: Secondary | ICD-10-CM

## 2012-06-21 DIAGNOSIS — F319 Bipolar disorder, unspecified: Secondary | ICD-10-CM

## 2012-06-21 DIAGNOSIS — Z1211 Encounter for screening for malignant neoplasm of colon: Secondary | ICD-10-CM

## 2012-06-21 LAB — HEPATIC FUNCTION PANEL
ALT: 17 U/L (ref 0–35)
AST: 22 U/L (ref 0–37)
Albumin: 4.4 g/dL (ref 3.5–5.2)
Total Bilirubin: 0.6 mg/dL (ref 0.3–1.2)
Total Protein: 7.6 g/dL (ref 6.0–8.3)

## 2012-06-21 LAB — CBC WITH DIFFERENTIAL/PLATELET
Eosinophils Relative: 12.9 % — ABNORMAL HIGH (ref 0.0–5.0)
HCT: 39.8 % (ref 36.0–46.0)
Hemoglobin: 13.2 g/dL (ref 12.0–15.0)
Lymphs Abs: 1.3 10*3/uL (ref 0.7–4.0)
MCV: 90.1 fl (ref 78.0–100.0)
Monocytes Absolute: 0.6 10*3/uL (ref 0.1–1.0)
Neutro Abs: 5.2 10*3/uL (ref 1.4–7.7)
Platelets: 254 10*3/uL (ref 150.0–400.0)
RDW: 14.2 % (ref 11.5–14.6)
WBC: 8.2 10*3/uL (ref 4.5–10.5)

## 2012-06-21 NOTE — Patient Instructions (Signed)
It was good to see you today. Test(s) ordered today. Your results will be released to MyChart (or called to you) after review, usually within 72hours after test completion. If any changes need to be made, you will be notified at that same time. Medications reviewed and updated we'll make referral to Dr Matthias Hughs . Our office will contact you regarding appointment(s) once made. Will fax lab result to Dr Jennelle Human as requested Please schedule followup in 6 months, call sooner if problems.

## 2012-06-21 NOTE — Progress Notes (Signed)
  Subjective:    Patient ID: [name], female    DOB: Jun 29, 1940, 71 y.o.   MRN: [id number]  HPI  Here for follow up -reviewed chronic med issues  Past Medical History  Diagnosis Date  . Hypothyroid   . Bipolar disease, chronic   . Hyperlipidemia   . Osteoporosis   . Asthma   . GERD (gastroesophageal reflux disease)   . Elevated blood pressure     has good outpatient control.   . Normal nuclear stress test 2003    Review of Systems  Constitutional: Negative for fever and fatigue.  Musculoskeletal: Negative for back pain.  Psychiatric/Behavioral: Positive for decreased concentration. Negative for hallucinations and dysphoric mood.       Objective:   Physical Exam  BP 122/80  Pulse 73  Temp 97.7 F (36.5 C) (Oral)  Ht 5\' 6"  (1.676 m)  Wt 156 lb 1.9 oz (70.816 kg)  BMI 25.20 kg/m2  SpO2 91% Wt Readings from Last 3 Encounters:  06/21/12 156 lb 1.9 oz (70.816 kg)  01/02/12 154 lb (69.854 kg)  12/21/11 156 lb (70.761 kg)   Constitutional: She is well-developed and well-nourished. No distress. Spouse at side Cardiovascular: Normal rate, regular rhythm and normal heart sounds.  No murmur heard. No BLE edema. Pulmonary/Chest: Effort normal and breath sounds normal. No respiratory distress. She has no wheezes.  Psychiatric: She has a pleasant mood and affect. Her behavior is normal. Judgment and thought content distractable and repetitive.   Lab Results  Component Value Date   WBC 8.4 02/05/2012   HGB 12.7 02/05/2012   HCT 38.5 02/05/2012   PLT 216.0 02/05/2012   GLUCOSE 101* 09/18/2011   CHOL 179 10/23/2011   TRIG 87.0 10/23/2011   HDL 73.70 10/23/2011   LDLDIRECT 161.3 01/06/2011   LDLCALC 88 10/23/2011   ALT 13 10/23/2011   AST 20 10/23/2011   NA 139 09/18/2011   K 4.0 09/18/2011   CL 107 09/18/2011   CREATININE 0.8 09/18/2011   BUN 7 09/18/2011   CO2 24 09/18/2011   TSH 1.23 02/01/2011   HGBA1C 5.7 02/23/2011       Assessment & Plan:   See problem list.  Medications and labs reviewed today.

## 2012-06-21 NOTE — Assessment & Plan Note (Signed)
On prava - recent lipids reviewed Follows with cards for same The current medical regimen is effective;  continue present plan and medications.

## 2012-06-21 NOTE — Assessment & Plan Note (Signed)
Compensated on current medications - continue to follow with dr. Jennelle Human as ongoing Check Li level as requested and fax to [phone]

## 2012-06-21 NOTE — Assessment & Plan Note (Signed)
Lab Results  Component Value Date   TSH 1.23 02/01/2011  check lab and adjust as needed The current medical regimen is effective;  continue present plan and medications.

## 2012-06-22 LAB — LITHIUM LEVEL: Lithium Lvl: 0.8 mEq/L (ref 0.80–1.40)

## 2012-06-24 IMAGING — CR DG CHEST 2V
2 series · 2 of 2 positions shown · non-contrast
Comparison: CT chest 09/28/2004.

CLINICAL DATA: Physical.  Asthma with chest tightness.

CHEST - 2 VIEW

[view not recorded (1 of 2)]
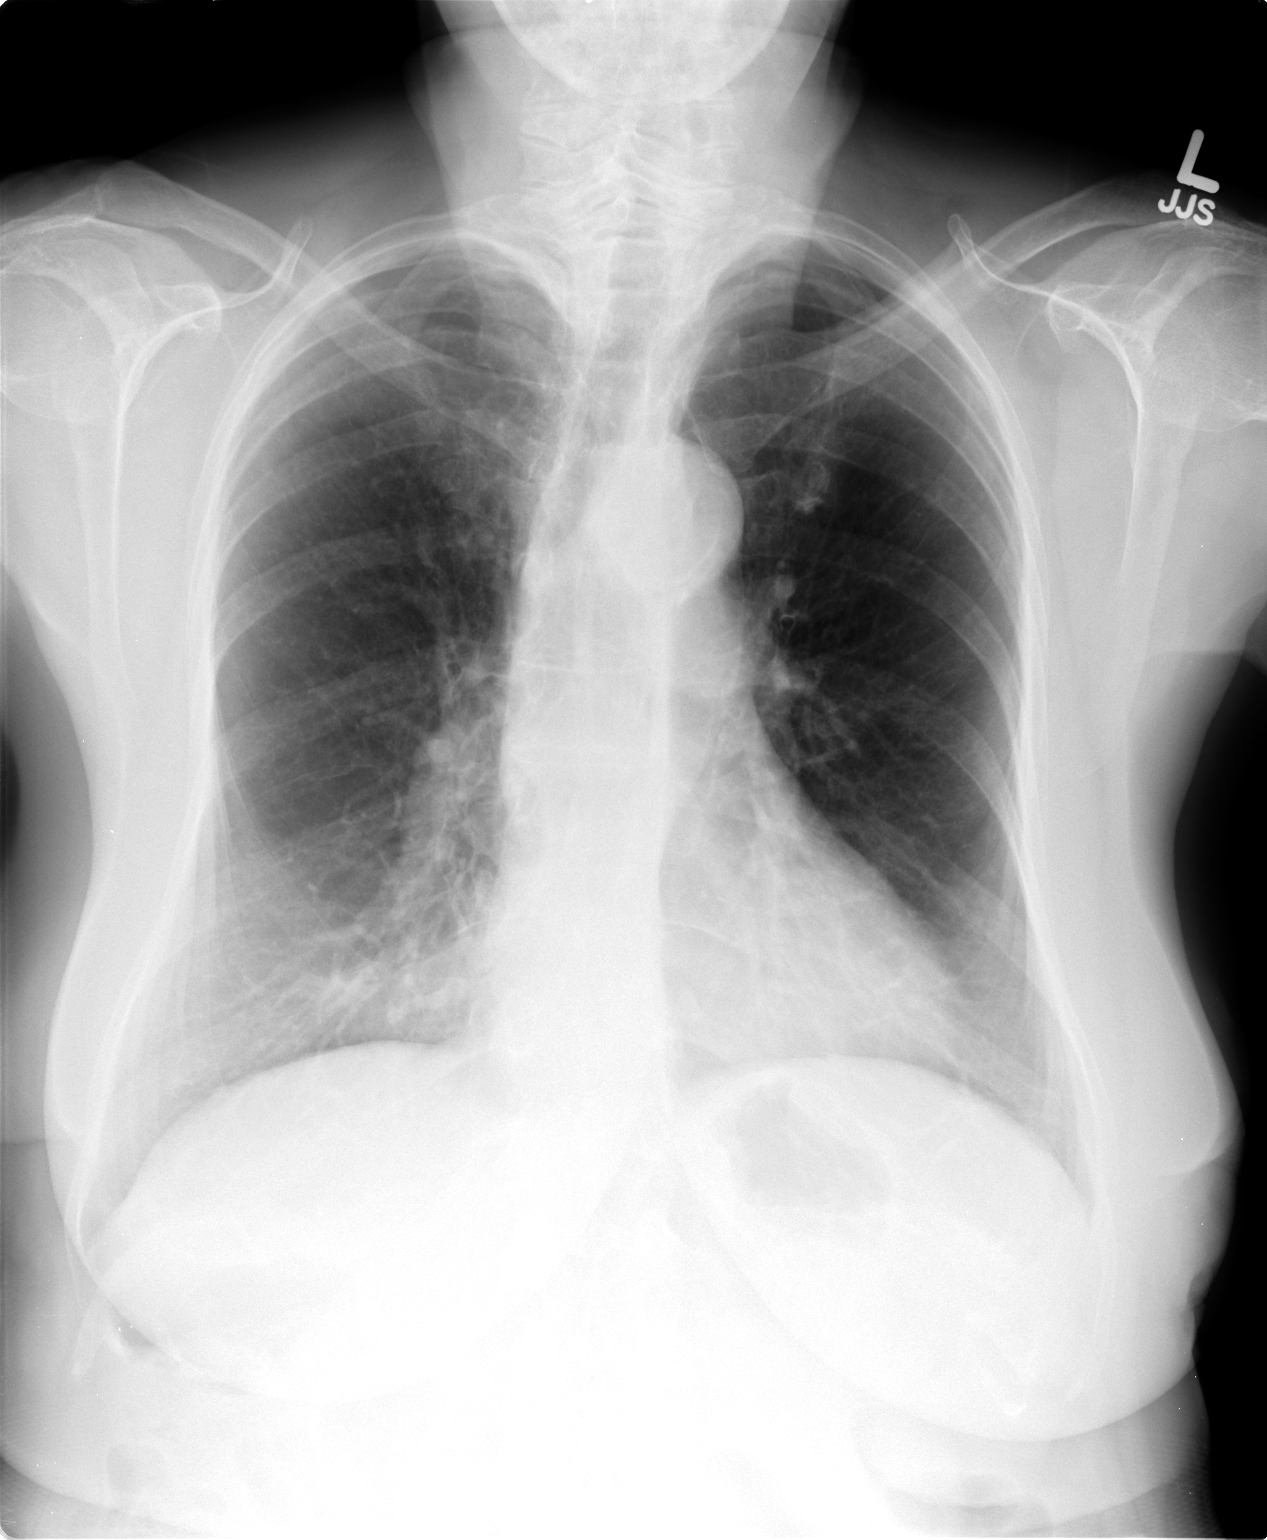

[view not recorded (2 of 2)]
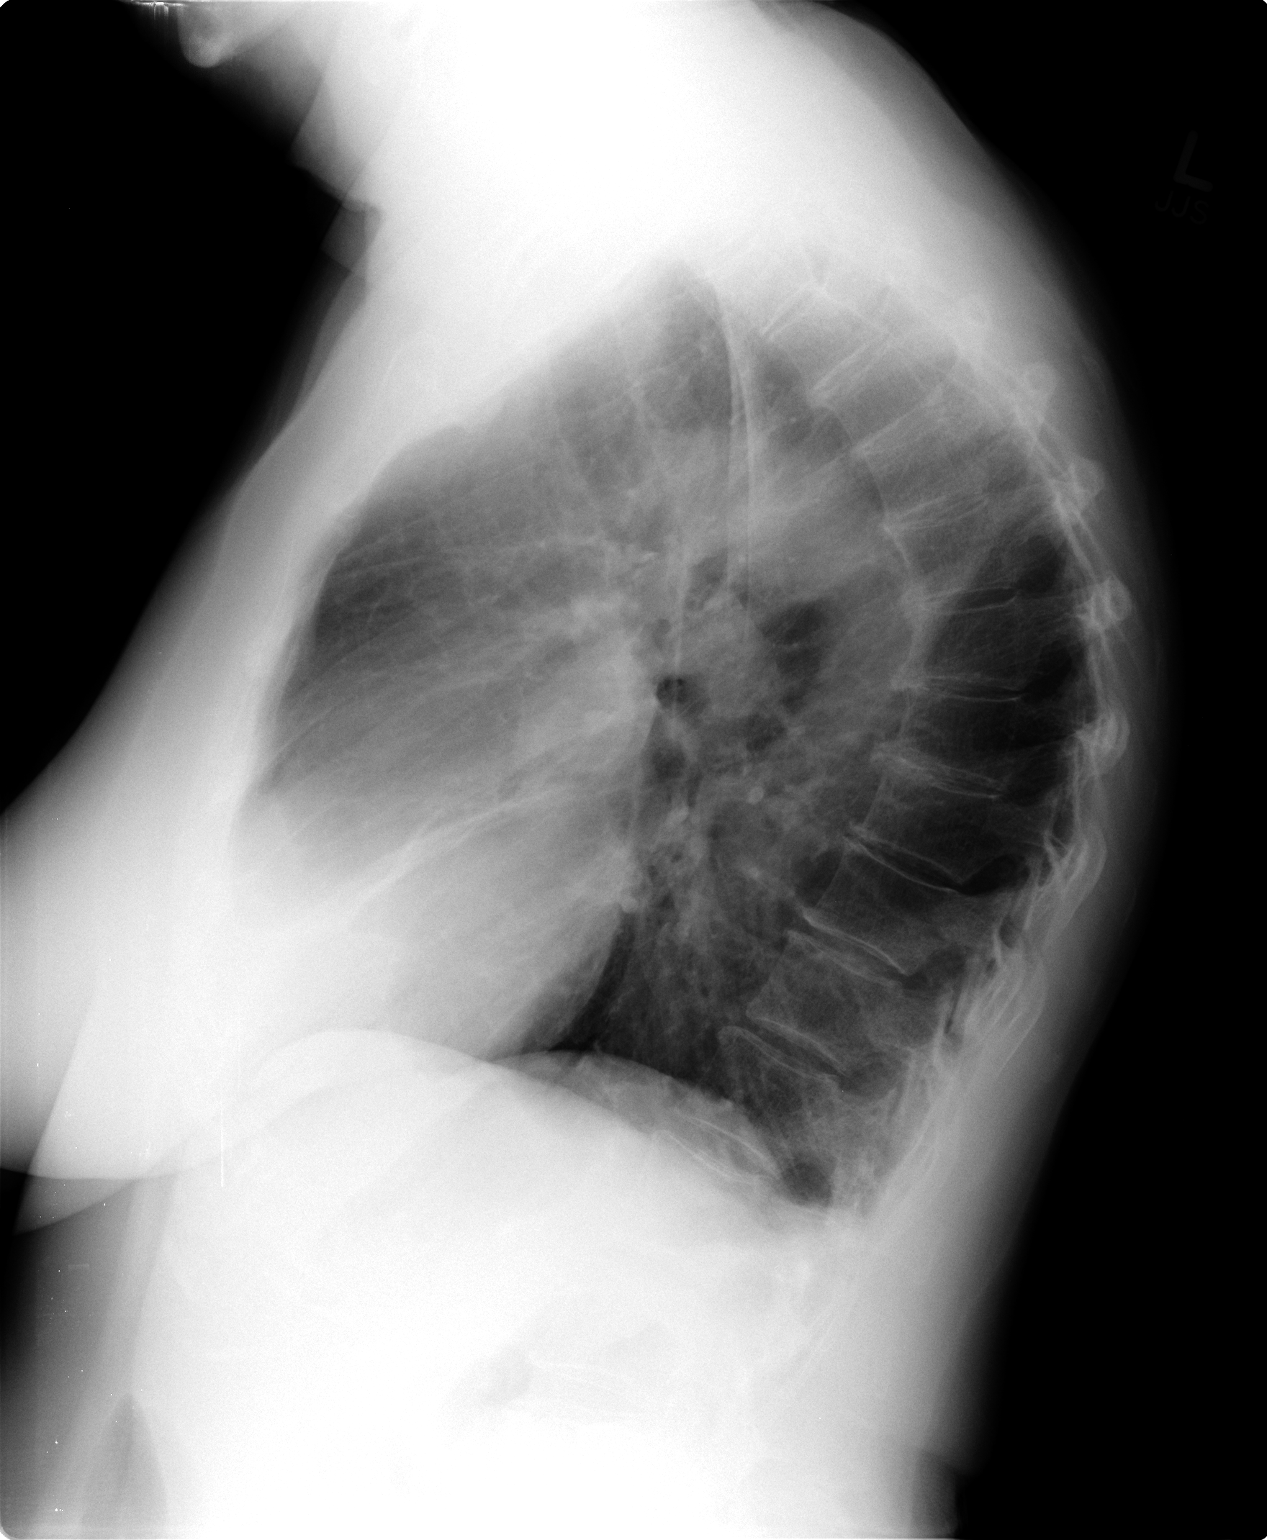

[2 of 2 positions shown; findings below may reference images not displayed]

FINDINGS: Trachea is midline.  Heart size normal.  Thoracic aorta
is calcified.  There is scarring in the right middle lobe and
lingula.  Lungs are otherwise clear.  No pleural fluid.
IMPRESSION: No acute findings.

## 2012-06-27 ENCOUNTER — Other Ambulatory Visit: Payer: Self-pay | Admitting: *Deleted

## 2012-06-27 ENCOUNTER — Other Ambulatory Visit: Payer: Self-pay | Admitting: Internal Medicine

## 2012-06-27 MED ORDER — FLUTICASONE PROPIONATE HFA 44 MCG/ACT IN AERO: INHALATION_SPRAY | RESPIRATORY_TRACT | Status: DC

## 2012-07-11 DIAGNOSIS — M949 Disorder of cartilage, unspecified: Secondary | ICD-10-CM | POA: Diagnosis not present

## 2012-07-11 DIAGNOSIS — Z13820 Encounter for screening for osteoporosis: Secondary | ICD-10-CM | POA: Diagnosis not present

## 2012-07-11 DIAGNOSIS — M899 Disorder of bone, unspecified: Secondary | ICD-10-CM | POA: Diagnosis not present

## 2012-07-11 DIAGNOSIS — Z1231 Encounter for screening mammogram for malignant neoplasm of breast: Secondary | ICD-10-CM | POA: Diagnosis not present

## 2012-08-13 DIAGNOSIS — F319 Bipolar disorder, unspecified: Secondary | ICD-10-CM | POA: Diagnosis not present

## 2012-09-27 ENCOUNTER — Other Ambulatory Visit: Payer: Self-pay | Admitting: Internal Medicine

## 2012-11-28 ENCOUNTER — Ambulatory Visit (INDEPENDENT_AMBULATORY_CARE_PROVIDER_SITE_OTHER): Payer: Medicare Other | Admitting: Internal Medicine

## 2012-11-28 ENCOUNTER — Encounter: Payer: Self-pay | Admitting: Internal Medicine

## 2012-11-28 VITALS — BP 142/86 | HR 85 | Ht 66.0 in | Wt 153.6 lb

## 2012-11-28 DIAGNOSIS — J45909 Unspecified asthma, uncomplicated: Secondary | ICD-10-CM

## 2012-11-28 DIAGNOSIS — J45998 Other asthma: Secondary | ICD-10-CM

## 2012-11-28 MED ORDER — FLUTICASONE PROPIONATE HFA 44 MCG/ACT IN AERO: INHALATION_SPRAY | RESPIRATORY_TRACT | Status: DC

## 2012-11-28 MED ORDER — ALBUTEROL SULFATE HFA 108 (90 BASE) MCG/ACT IN AERS: INHALATION_SPRAY | RESPIRATORY_TRACT | Status: DC

## 2012-11-28 NOTE — Patient Instructions (Addendum)
Script for rescue inhaler refill sent   We can stay off allergy vaccine now and see how you do.   :Please call as needed

## 2012-11-28 NOTE — Progress Notes (Signed)
11/23/11- 71 yoF passive smoker followed for asthma, allergy complicated by bipolar disorder, hypothyroid, HBP LOV-04/14/10 Remote history of eosinophilic pneumonia. Allergy vaccine still helps. She needs EpiPen refill. Infrequent need for rescue inhaler. Feeling very well now. Clinical Data: Cough with left lower lobe nodules on chest radiograph. 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  CT OF THE CHEST WITH CONTRAST: 09/28/04 Multidetector helical CT imaging was performed during the IV bolus injection of 100 cc Omnipaque 300. There are no chest radiographs available for correlation.  The lungs are clear without evidence of pulmonary nodule. There is no endobronchial lesion or confluent air space opacity. There is no pleural or pericardial effusion. No enlarged mediastinal or hilar lymph nodes are present. The visualized upper abdomen is unremarkable.  IMPRESSION:  Normal CT of the chest. No evidence of pulmonary nodule.  jst  Provider: Hinda Kehr, Niel Hummer   11/28/12 71 yoF passive smoker followed for asthma, allergy complicated by bipolar disorder, hypothyroid, HBP follows for :  no asthma flares at this time.  synthroid makes her so sleepy and tired during the day.  no refills needed today. Lost her husband/MI. Coping. Using rescue inhaler about twice a week. Dropped off of allergy vaccine because her husband was giving her shots. CXR 11/30/11 IMPRESSION:  No acute findings.  Original Report Authenticated By: Reyes Ivan, M.D.  ROS-see HPI Constitutional:   No-  weight loss, night sweats, fevers, chills, +fatigue, lassitude. HEENT:   No-  headaches, difficulty swallowing, tooth/dental problems, sore throat,       No-  sneezing, itching, ear ache, nasal congestion, post nasal drip,  CV:  No-   chest pain, orthopnea, PND, swelling in lower extremities, anasarca, dizziness, palpitations Resp: No-   shortness of breath with exertion or at rest.              No-    productive cough,  No non-productive cough,  No- coughing up of blood.              No-   change in color of mucus.  No- wheezing.   Skin: No-   rash or lesions. GI:  No-   heartburn, indigestion, abdominal pain, nausea, vomiting, GU: . MS:  No-   joint pain or swelling.   Neuro-     nothing unusual Psych:  No- change in mood or affect. No depression or anxiety.  No memory loss.  OBJ- Physical Exam General- Alert, Oriented, Affect-appropriate, Distress- none acute, talkative, trim Skin- rash-none, lesions- none, excoriation- none Lymphadenopathy- none Head- atraumatic            Eyes- Gross vision intact, PERRLA, conjunctivae and secretions clear            Ears- Hearing, canals-normal            Nose- Clear, no-Septal dev, mucus, polyps, erosion, perforation             Throat- Mallampati II , mucosa clear , drainage- none, tonsils- atrophic Neck- flexible , trachea midline, no stridor , thyroid nl, carotid no bruit Chest - symmetrical excursion , unlabored           Heart/CV- RRR , no murmur , no gallop  , no rub, nl s1 s2                           - JVD- none , edema- none, stasis changes- none, varices- none  Lung- clear to P&A, wheeze- none, cough- none , dullness-none, rub- none           Chest wall-  Abd-  Br/ Gen/ Rectal- Not done, not indicated Extrem- cyanosis- none, clubbing, none, atrophy- none, strength- nl Neuro- grossly intact to observation

## 2012-12-03 DIAGNOSIS — F319 Bipolar disorder, unspecified: Secondary | ICD-10-CM | POA: Diagnosis not present

## 2012-12-10 NOTE — Assessment & Plan Note (Signed)
She has stopped allergy vaccine. We are interested to see how she does without it now but it is realistic to expect she may stay comfortable long-term. Rescue inhaler is refilled

## 2013-01-06 DIAGNOSIS — J45909 Unspecified asthma, uncomplicated: Secondary | ICD-10-CM | POA: Diagnosis not present

## 2013-01-06 DIAGNOSIS — F329 Major depressive disorder, single episode, unspecified: Secondary | ICD-10-CM | POA: Diagnosis not present

## 2013-01-06 DIAGNOSIS — M810 Age-related osteoporosis without current pathological fracture: Secondary | ICD-10-CM | POA: Diagnosis not present

## 2013-01-18 ENCOUNTER — Other Ambulatory Visit: Payer: Self-pay | Admitting: Cardiology

## 2013-02-03 DIAGNOSIS — F319 Bipolar disorder, unspecified: Secondary | ICD-10-CM | POA: Diagnosis not present

## 2013-03-10 DIAGNOSIS — F319 Bipolar disorder, unspecified: Secondary | ICD-10-CM | POA: Diagnosis not present

## 2013-03-13 DIAGNOSIS — Z23 Encounter for immunization: Secondary | ICD-10-CM | POA: Diagnosis not present

## 2013-03-20 ENCOUNTER — Other Ambulatory Visit: Payer: Self-pay | Admitting: Internal Medicine

## 2013-03-24 ENCOUNTER — Other Ambulatory Visit (INDEPENDENT_AMBULATORY_CARE_PROVIDER_SITE_OTHER): Payer: Medicare Other

## 2013-03-24 ENCOUNTER — Encounter: Payer: Self-pay | Admitting: Internal Medicine

## 2013-03-24 ENCOUNTER — Ambulatory Visit (INDEPENDENT_AMBULATORY_CARE_PROVIDER_SITE_OTHER): Payer: Medicare Other | Admitting: Internal Medicine

## 2013-03-24 VITALS — BP 142/88 | HR 73 | Temp 98.4°F | Wt 152.0 lb

## 2013-03-24 DIAGNOSIS — B070 Plantar wart: Secondary | ICD-10-CM

## 2013-03-24 DIAGNOSIS — E039 Hypothyroidism, unspecified: Secondary | ICD-10-CM

## 2013-03-24 DIAGNOSIS — E785 Hyperlipidemia, unspecified: Secondary | ICD-10-CM

## 2013-03-24 DIAGNOSIS — M25569 Pain in unspecified knee: Secondary | ICD-10-CM | POA: Diagnosis not present

## 2013-03-24 DIAGNOSIS — M25561 Pain in right knee: Secondary | ICD-10-CM

## 2013-03-24 LAB — LIPID PANEL
HDL: 66.4 mg/dL (ref >39.00)
Total CHOL/HDL Ratio: 3
VLDL: 32 mg/dL (ref 0.0–40.0)

## 2013-03-24 MED ORDER — ACETAMINOPHEN 500 MG PO TABS: ORAL_TABLET | ORAL | Status: DC

## 2013-03-24 MED ORDER — SALICYLIC ACID 40 % EX PADS: MEDICATED_PAD | CUTANEOUS | Status: DC

## 2013-03-24 NOTE — Assessment & Plan Note (Signed)
On prava - recent lipids reviewed Follows with cards for same The current medical regimen is effective;  continue present plan and medications.

## 2013-03-24 NOTE — Progress Notes (Signed)
  Subjective:    Patient ID: [name], female    DOB: Sep 20, 1940, 72 y.o.   MRN: [id number]  HPI Comments: Tender spot under little toe on L foot- no foreign body or injury but painful with WB.  Pain present >62mo, associated with with "bump" at site of pain -unchanged in size  Foot Injury   Also R knee pain - remote injury - now mild flare of pain with activity - no swelling  Also reviewed chronic med issues  Past Medical History  Diagnosis Date  . Hypothyroid   . Bipolar disease, chronic   . Hyperlipidemia   . Osteoporosis   . Asthma   . GERD (gastroesophageal reflux disease)   . Elevated blood pressure     has good outpatient control.   . Normal nuclear stress test 2003    Review of Systems  Constitutional: Negative for fever and fatigue.  Musculoskeletal: Negative for back pain.  Psychiatric/Behavioral: Positive for decreased concentration. Negative for hallucinations and dysphoric mood.       Objective:   Physical Exam BP 142/88  Pulse 73  Temp(Src) 98.4 F (36.9 C) (Oral)  Wt 152 lb (68.947 kg)  BMI 24.55 kg/m2  SpO2 95% Wt Readings from Last 3 Encounters:  03/24/13 152 lb (68.947 kg)  11/28/12 153 lb 9.6 oz (69.673 kg)  06/21/12 156 lb 1.9 oz (70.816 kg)   Constitutional: She is well-developed and well-nourished. No distress.  Cardiovascular: Normal rate, regular rhythm and normal heart sounds.  No murmur heard. No BLE edema. Pulmonary/Chest: Effort normal and breath sounds normal. No respiratory distress. She has no wheezes.  MSKEL: L foot with plantar wart over 5th MT head - no inflammation - no soft tissue swelling. R knee - boggy synovitis - tender to palpation over joint line; FROM and ligamentous function intact Psychiatric: She has a pleasant mood and affect. Her behavior is normal. Judgment and thought content distractable and repetitive.   Lab Results  Component Value Date   WBC 8.2 06/21/2012   HGB 13.2 06/21/2012   HCT 39.8 06/21/2012    PLT 254.0 06/21/2012   GLUCOSE 101* 09/18/2011   CHOL 179 10/23/2011   TRIG 87.0 10/23/2011   HDL 73.70 10/23/2011   LDLDIRECT 161.3 01/06/2011   LDLCALC 88 10/23/2011   ALT 17 06/21/2012   AST 22 06/21/2012   NA 139 09/18/2011   K 4.0 09/18/2011   CL 107 09/18/2011   CREATININE 0.8 09/18/2011   BUN 7 09/18/2011   CO2 24 09/18/2011   TSH 1.32 06/21/2012   HGBA1C 5.7 02/23/2011       Assessment & Plan:   Plantar wart, L foot - education and reassurance provided - due to small size will try OTC rx 1st - patient to call for podiatry referral if symptoms unimproved with over-the-counter tx  Right knee pain, recurrent -reports remote trauma, suspect DJD -check plain films today to evaluate same. Recommend over-the-counter Tylenol twice a day. Patient call symptoms worse or unimproved with over-the-counter care   Also see problem list. Medications and labs reviewed today.

## 2013-03-24 NOTE — Patient Instructions (Addendum)
It was good to see you today. Use over-the-counter medications to treat your problems as discussed: Dr. Margart Sickles "wart" or corn pads for your foot and Tylenol twice daily for arthritis X-ray of your knee and labs ordered today. Your results will be released to MyChart (or called to you) after review, usually within 72hours after test completion. If any changes need to be made, you will be notified at that same time. Call if symptoms worse or unimproved  Plantar Warts Plantar warts are growths on the bottom of your foot. Warts are caused by a germ.  HOME CARE  Soak your foot in warm water. Dry your foot when you are done. Remove the top layer of softened skin, then apply any medicine as told by your doctor.  Remove any bandages daily. File off extra wart tissue. Repeat this as told by your doctor until the wart goes away.  Only use medicine as told by your doctor.  Use a bandage with a hole in it (doughnut bandage) to relieve pain.Put the hole over the wart.  Wear shoes and socks and change them daily.  Keep your foot clean and dry.  Check your feet regularly.  Avoid contact with warts on other people.  Have your warts checked by your doctor. GET HELP RIGHT AWAY IF: The treated skin becomes red, puffy (swollen), or painful. MAKE SURE YOU:  Understand these instructions.  Will watch your condition.  Will get help right away if you are not doing well or get worse. Document Released: 07/15/2010 Document Revised: 09/04/2011 Document Reviewed: 07/15/2010 [facility] Patient Information 2014 [place], [place].

## 2013-03-24 NOTE — Assessment & Plan Note (Signed)
Lab Results  Component Value Date   TSH 1.32 06/21/2012  check lab and adjust as needed The current medical regimen is effective;  continue present plan and medications.

## 2013-03-28 ENCOUNTER — Other Ambulatory Visit: Payer: Self-pay

## 2013-03-28 DIAGNOSIS — Z1231 Encounter for screening mammogram for malignant neoplasm of breast: Secondary | ICD-10-CM

## 2013-03-30 ENCOUNTER — Other Ambulatory Visit: Payer: Self-pay | Admitting: Internal Medicine

## 2013-04-01 ENCOUNTER — Other Ambulatory Visit: Payer: Self-pay | Admitting: Internal Medicine

## 2013-04-08 ENCOUNTER — Other Ambulatory Visit: Payer: Self-pay | Admitting: Internal Medicine

## 2013-04-08 DIAGNOSIS — F319 Bipolar disorder, unspecified: Secondary | ICD-10-CM | POA: Diagnosis not present

## 2013-04-08 MED ORDER — PRAVASTATIN SODIUM 20 MG PO TABS: 20.0000 mg | ORAL_TABLET | Freq: Every day | ORAL | Status: DC

## 2013-04-08 MED ORDER — QUETIAPINE FUMARATE 25 MG PO TABS: 12.5000 mg | ORAL_TABLET | Freq: Every day | ORAL | Status: DC

## 2013-07-03 DIAGNOSIS — F319 Bipolar disorder, unspecified: Secondary | ICD-10-CM | POA: Diagnosis not present

## 2013-07-15 ENCOUNTER — Ambulatory Visit: Payer: Self-pay

## 2013-07-16 ENCOUNTER — Ambulatory Visit: Payer: Self-pay | Admitting: Internal Medicine

## 2013-07-16 ENCOUNTER — Ambulatory Visit (INDEPENDENT_AMBULATORY_CARE_PROVIDER_SITE_OTHER): Payer: Self-pay | Admitting: Internal Medicine

## 2013-07-16 DIAGNOSIS — F319 Bipolar disorder, unspecified: Secondary | ICD-10-CM

## 2013-07-16 DIAGNOSIS — G3184 Mild cognitive impairment, so stated: Secondary | ICD-10-CM
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
## 2013-07-17 ENCOUNTER — Encounter: Payer: Self-pay | Admitting: Internal Medicine

## 2013-07-25 ENCOUNTER — Other Ambulatory Visit: Payer: Self-pay | Admitting: Internal Medicine

## 2013-07-29 ENCOUNTER — Telehealth: Payer: Self-pay

## 2013-07-29 NOTE — Telephone Encounter (Signed)
Called pt no answer LMOM with md response.../lmb 

## 2013-07-29 NOTE — Telephone Encounter (Signed)
The patient called and is questioning if she should continue taking the pravastatin sodium 20mg  or if she should stop the medication.  pts callback - [phone]

## 2013-07-29 NOTE — Telephone Encounter (Signed)
Is patient taking pravastatin or simvastatin? My records show she is prescribe simvastatin 20 mg daily at this time... I would recommend she continue taking one of these 2 meds - either prava 20 or simva 20 - once daily

## 2013-08-05 DIAGNOSIS — Z79899 Other long term (current) drug therapy: Secondary | ICD-10-CM | POA: Diagnosis not present

## 2013-08-05 DIAGNOSIS — F3189 Other bipolar disorder: Secondary | ICD-10-CM | POA: Diagnosis not present

## 2013-10-21 DIAGNOSIS — Z1212 Encounter for screening for malignant neoplasm of rectum: Secondary | ICD-10-CM | POA: Diagnosis not present

## 2013-10-21 DIAGNOSIS — Z124 Encounter for screening for malignant neoplasm of cervix: Secondary | ICD-10-CM | POA: Diagnosis not present

## 2013-10-21 DIAGNOSIS — Z1231 Encounter for screening mammogram for malignant neoplasm of breast: Secondary | ICD-10-CM | POA: Diagnosis not present

## 2013-10-23 ENCOUNTER — Other Ambulatory Visit: Payer: Self-pay | Admitting: Internal Medicine

## 2013-10-23 DIAGNOSIS — F319 Bipolar disorder, unspecified: Secondary | ICD-10-CM | POA: Diagnosis not present

## 2013-12-01 ENCOUNTER — Ambulatory Visit: Payer: Medicare Other | Admitting: Internal Medicine

## 2013-12-24 ENCOUNTER — Other Ambulatory Visit: Payer: Self-pay | Admitting: Internal Medicine

## 2014-01-21 ENCOUNTER — Other Ambulatory Visit: Payer: Self-pay | Admitting: Internal Medicine

## 2014-01-21 DIAGNOSIS — F3132 Bipolar disorder, current episode depressed, moderate: Secondary | ICD-10-CM | POA: Diagnosis not present

## 2014-01-27 DIAGNOSIS — F319 Bipolar disorder, unspecified: Secondary | ICD-10-CM | POA: Diagnosis not present

## 2014-02-08 ENCOUNTER — Other Ambulatory Visit: Payer: Self-pay | Admitting: Internal Medicine

## 2014-02-24 DIAGNOSIS — H269 Unspecified cataract: Secondary | ICD-10-CM | POA: Diagnosis not present

## 2014-03-10 ENCOUNTER — Other Ambulatory Visit: Payer: Self-pay | Admitting: Internal Medicine

## 2014-03-10 NOTE — Telephone Encounter (Signed)
Last OV with you was 06/2013 with no upcoming appts--Rx never filled just on historical list--please advise

## 2014-04-30 DIAGNOSIS — F319 Bipolar disorder, unspecified: Secondary | ICD-10-CM | POA: Diagnosis not present

## 2014-05-08 ENCOUNTER — Encounter: Payer: Self-pay | Admitting: Internal Medicine

## 2014-05-14 DIAGNOSIS — Z23 Encounter for immunization: Secondary | ICD-10-CM | POA: Diagnosis not present

## 2014-06-07 ENCOUNTER — Other Ambulatory Visit: Payer: Self-pay | Admitting: Internal Medicine

## 2014-06-08 ENCOUNTER — Other Ambulatory Visit: Payer: Self-pay | Admitting: Internal Medicine

## 2014-06-15 ENCOUNTER — Telehealth: Payer: Self-pay | Admitting: Internal Medicine

## 2014-06-15 ENCOUNTER — Other Ambulatory Visit: Payer: Self-pay | Admitting: Internal Medicine

## 2014-06-15 NOTE — Telephone Encounter (Signed)
Called and spoke with pt and she stated that she only uses the flovent HFA prn .  She was last seen by CY on 11/28/2012.  Pt stated that she is not able to get out and drive to [place] anymore and now lives in [place].  She wanted to see if we can send in refills of the flovent.  i tried to schedule her an appt and pt wanted to see if CY has any other suggestions.  CY please advise. Thanks  Allergies  Allergen Reactions  . Lidocaine   . Pneumococcal Vaccines     Current Outpatient Prescriptions on File Prior to Visit  Medication Sig Dispense Refill  . acetaminophen (TYLENOL) 325 MG tablet Take 650 mg by mouth daily.      Marland Kitchen acetaminophen (TYLENOL) 500 MG tablet 1 tablet twice daily for arthritis pain and 1 every 6 hours as needed for fever/other pain 30 tablet 0  . albuterol (PROAIR HFA) 108 (90 BASE) MCG/ACT inhaler 2 puffs every 4 hours as needed- rescue inhaler 2 Inhaler prn  . ALPRAZolam (XANAX) 0.25 MG tablet Take 0.25 mg by mouth at bedtime as needed.      . ALPRAZolam (XANAX) 0.25 MG tablet Take 1/4 tab daily 30 tablet   . aspirin 81 MG tablet Take 81 mg by mouth 3 (three) times a week.      . calcium carbonate (TUMS) 500 MG chewable tablet Chew 2 tablets by mouth at bedtime.     . cholecalciferol (VITAMIN D) 1000 UNITS tablet Take 1,000 Units by mouth daily.    Marland Kitchen EPINEPHrine (EPIPEN) 0.3 mg/0.3 mL DEVI Inject 0.3 mLs (0.3 mg total) into the muscle once. 1 Device prn  . FLOVENT HFA 44 MCG/ACT inhaler INHALE 2 PUFFS INTO THE LUNGS 2 (TWO) TIMES DAILY AS NEEDED. 1 Inhaler 0  . Fluticasone Propionate, Inhal, (FLOVENT IN) Inhale into the lungs as needed.      . labetalol (NORMODYNE) 100 MG tablet Take 100 mg by mouth 3 (three) times a week.      . levothyroxine (SYNTHROID, LEVOTHROID) 88 MCG tablet Take 88 mcg by mouth daily.      Marland Kitchen lithium (LITHOBID) 300 MG CR tablet 2 tablets by mouth at bedtime    . lithium 300 MG capsule Take 300 mg by mouth 2 (two) times daily with a meal.      .  mometasone (NASONEX) 50 MCG/ACT nasal spray Place 2 sprays into the nose as needed.      . mometasone (NASONEX) 50 MCG/ACT nasal spray 2 sprays by Nasal route as needed.      . polyethylene glycol (GLYCOLAX) packet Take 17 g by mouth daily as needed.     . polyethylene glycol (MIRALAX / GLYCOLAX) packet Take 17 g by mouth daily.      . pravastatin (PRAVACHOL) 20 MG tablet TAKE 1 TABLET (20 MG TOTAL) BY MOUTH DAILY. 90 tablet 0  . pravastatin (PRAVACHOL) 20 MG tablet TAKE 1 TABLET (20 MG TOTAL) BY MOUTH DAILY. 90 tablet 0  . QUEtiapine (SEROQUEL) 25 MG tablet Take 12.5 mg by mouth at bedtime.      Marland Kitchen QUEtiapine (SEROQUEL) 25 MG tablet Take 0.5 tablets (12.5 mg total) by mouth at bedtime. 45 tablet 0  . Salicylic Acid 40 % PADS Apply medicated pad to affected area on foot daily or as instructed  0  . simvastatin (ZOCOR) 20 MG tablet Take 20 mg by mouth at bedtime.      Marland Kitchen SYNTHROID  75 MCG tablet TAKE 1 TABLET BY MOUTH DAILY 90 tablet 1  . teriparatide (FORTEO) 750 MCG/3ML injection Inject 20 mcg into the skin daily.       No current facility-administered medications on file prior to visit.

## 2014-06-15 NOTE — Telephone Encounter (Signed)
Flovent is an anti-inflammatory for maintenance control to be used on a regular basis at least in times of need, not a rescue inhaler. I suggest she get script filled by her local primary MD (in [place]?). We can see her again if she needs [place], but any primary doc could Rx Flovent.

## 2014-06-16 ENCOUNTER — Other Ambulatory Visit: Payer: Self-pay | Admitting: Internal Medicine

## 2014-06-16 DIAGNOSIS — Z01411 Encounter for gynecological examination (general) (routine) with abnormal findings: Secondary | ICD-10-CM | POA: Diagnosis not present

## 2014-06-16 DIAGNOSIS — K5901 Slow transit constipation: Secondary | ICD-10-CM | POA: Diagnosis not present

## 2014-06-16 DIAGNOSIS — Z0001 Encounter for general adult medical examination with abnormal findings: Secondary | ICD-10-CM | POA: Diagnosis not present

## 2014-06-16 DIAGNOSIS — M810 Age-related osteoporosis without current pathological fracture: Secondary | ICD-10-CM | POA: Diagnosis not present

## 2014-06-16 DIAGNOSIS — Z124 Encounter for screening for malignant neoplasm of cervix: Secondary | ICD-10-CM | POA: Diagnosis not present

## 2014-06-16 MED ORDER — FLUTICASONE PROPIONATE HFA 44 MCG/ACT IN AERO: INHALATION_SPRAY | RESPIRATORY_TRACT | Status: DC

## 2014-06-16 NOTE — Telephone Encounter (Signed)
ATC pt. Home phone and mobile phone, both with no option of vm. WCB.

## 2014-06-17 NOTE — Telephone Encounter (Signed)
I spoke with the pt and she states she was able to get her PCP to fill the inhaler. She is interested in changing to Dr. Stevenson Clinch since he is much closer to her. She states she will call back if she decides to schedule an appt. Ophir Bing, CMA

## 2014-07-14 DIAGNOSIS — M810 Age-related osteoporosis without current pathological fracture: Secondary | ICD-10-CM | POA: Diagnosis not present

## 2014-08-03 DIAGNOSIS — M810 Age-related osteoporosis without current pathological fracture: Secondary | ICD-10-CM | POA: Diagnosis not present

## 2014-08-13 ENCOUNTER — Ambulatory Visit: Payer: Self-pay | Admitting: Gastroenterology

## 2014-08-13 DIAGNOSIS — Z888 Allergy status to other drugs, medicaments and biological substances status: Secondary | ICD-10-CM | POA: Diagnosis not present

## 2014-08-13 DIAGNOSIS — K5730 Diverticulosis of large intestine without perforation or abscess without bleeding: Secondary | ICD-10-CM | POA: Diagnosis not present

## 2014-08-13 DIAGNOSIS — J45909 Unspecified asthma, uncomplicated: Secondary | ICD-10-CM | POA: Diagnosis not present

## 2014-08-13 DIAGNOSIS — Z1211 Encounter for screening for malignant neoplasm of colon: Secondary | ICD-10-CM | POA: Diagnosis not present

## 2014-09-21 ENCOUNTER — Encounter: Payer: Self-pay | Admitting: Nurse Practitioner

## 2014-09-21 ENCOUNTER — Ambulatory Visit (INDEPENDENT_AMBULATORY_CARE_PROVIDER_SITE_OTHER): Payer: Medicare Other | Admitting: Nurse Practitioner

## 2014-09-21 VITALS — BP 158/90 | HR 70 | Temp 98.3°F | Resp 14 | Ht 66.0 in | Wt 148.8 lb

## 2014-09-21 DIAGNOSIS — I10 Essential (primary) hypertension: Secondary | ICD-10-CM | POA: Diagnosis not present

## 2014-09-21 DIAGNOSIS — F419 Anxiety disorder, unspecified: Secondary | ICD-10-CM

## 2014-09-21 DIAGNOSIS — R300 Dysuria: Secondary | ICD-10-CM | POA: Diagnosis not present

## 2014-09-21 DIAGNOSIS — J45998 Other asthma: Secondary | ICD-10-CM

## 2014-09-21 DIAGNOSIS — F319 Bipolar disorder, unspecified: Secondary | ICD-10-CM

## 2014-09-21 DIAGNOSIS — G3184 Mild cognitive impairment, so stated: Secondary | ICD-10-CM

## 2014-09-21 DIAGNOSIS — E038 Other specified hypothyroidism: Secondary | ICD-10-CM

## 2014-09-21 LAB — POCT URINALYSIS DIPSTICK
BILIRUBIN UA: NEGATIVE
Glucose, UA: NEGATIVE
Ketones, UA: NEGATIVE
NITRITE UA: NEGATIVE
Protein, UA: NEGATIVE
Spec Grav, UA: 1.01
UROBILINOGEN UA: 0.2
pH, UA: 6.5

## 2014-09-21 MED ORDER — SULFAMETHOXAZOLE-TRIMETHOPRIM 800-160 MG PO TABS
1.0000 | ORAL_TABLET | Freq: Two times a day (BID) | ORAL | Status: DC
Start: 2014-09-21 — End: 2014-10-07

## 2014-09-21 NOTE — Assessment & Plan Note (Signed)
Will check TSH, T4 free, and T3 because it has not been done in quite some time. Non-compliant on medications.

## 2014-09-21 NOTE — Progress Notes (Signed)
   Subjective:    Patient ID: [name], female    DOB: 1941/04/26, 74 y.o.   MRN: [id number]  HPI  [name] is a 74 yo female establishing care with [place] today. She was previously seen by Dr. Asa Lente and did not want to drive to [facility] any more. She has psychiatric issues listed as well as mild cognitive impairment. Her husband is in the lobby and confirms she needs referrals and is not taking medication excepting the asthma meds.   1) PNDrip and rhinorrhea- [facility] severe congestion syrup. Helpful to patient.   2) Left ear- painful for 1 week intermittently. No treatment to date. Improved today.   3) Dysuria x 1 month on and off.   4) Not feeling well over the past month, reports not taking medications.   Review of Systems  Constitutional: Positive for fatigue. Negative for fever, chills and diaphoresis.  HENT: Positive for ear pain.        Left  Respiratory: Negative for chest tightness, shortness of breath and wheezing.   Cardiovascular: Negative for chest pain, palpitations and leg swelling.  Gastrointestinal: Negative for nausea, vomiting and diarrhea.  Genitourinary: Positive for dysuria. Negative for frequency and flank pain.  Skin: Negative for rash.  Neurological: Negative for dizziness, weakness, numbness and headaches.  Psychiatric/Behavioral: The patient is not nervous/anxious.       Objective:   Physical Exam  Constitutional: She is oriented to person, place, and time. She appears well-developed and well-nourished. No distress.  BP 158/90 mmHg  Pulse 70  Temp(Src) 98.3 F (36.8 C) (Oral)  Resp 14  Ht 5\' 6"  (1.676 m)  Wt 148 lb 12.8 oz (67.495 kg)  BMI 24.03 kg/m2  SpO2 98%   HENT:  Head: Normocephalic and atraumatic.  Right Ear: External ear normal.  Left Ear: External ear normal.  Cardiovascular: Normal rate, regular rhythm and normal heart sounds.  Exam reveals no gallop and no friction rub.   No murmur heard. Pulmonary/Chest: Effort normal and  breath sounds normal. No respiratory distress. She has no wheezes. She has no rales. She exhibits no tenderness.  Neurological: She is alert and oriented to person, place, and time. No cranial nerve deficit. She exhibits normal muscle tone. Coordination normal.  Skin: Skin is warm and dry. No rash noted. She is not diaphoretic.  Psychiatric: She has a normal mood and affect. Her behavior is normal. Judgment and thought content normal.      Assessment & Plan:

## 2014-09-21 NOTE — Progress Notes (Signed)
Pre visit review using our clinic review tool, if applicable. No additional management support is needed unless otherwise documented below in the visit note. 

## 2014-09-21 NOTE — Assessment & Plan Note (Signed)
Possible UTI. POCT urine possible for UTI. Will try to culture. Minimal output given. Will follow.

## 2014-09-21 NOTE — Assessment & Plan Note (Signed)
Referral to get set up with a psychiatrist in [place] for further care. Not taking lithium currently. Self d/c'd

## 2014-09-21 NOTE — Patient Instructions (Addendum)
Please take your labetalol as instructed for your blood pressure!   We will contact you about your referrals to establish care with doctors in [place].   Follow up in 3 months.   Take the Bactrim DS twice a day for 3 days for your urinary symptoms.

## 2014-09-21 NOTE — Assessment & Plan Note (Signed)
Uncontrolled. Non-compliant on medications and has not been seeing Cardiology for follow up as directed. Will continue to see Dr. Aundra Dubin in [place].

## 2014-09-21 NOTE — Assessment & Plan Note (Signed)
Pt has rescue inhaler and is using it for symptoms. This is the only medication she is currently taking. Referral to pulmonology in [place].

## 2014-09-21 NOTE — Assessment & Plan Note (Signed)
No signs of anxiety today.

## 2014-09-21 NOTE — Assessment & Plan Note (Signed)
Pt was somewhat confused at the visit. She reported different things to the nurse than myself. Husband confirmed what she was reporting about taking almost no medications.

## 2014-09-22 ENCOUNTER — Telehealth: Payer: Self-pay | Admitting: Internal Medicine

## 2014-09-22 NOTE — Telephone Encounter (Signed)
emmi mailed  °

## 2014-09-23 LAB — URINE CULTURE: Colony Count: >=100000

## 2014-09-24 ENCOUNTER — Encounter: Payer: Self-pay | Admitting: Nurse Practitioner

## 2014-10-07 ENCOUNTER — Telehealth: Payer: Self-pay | Admitting: *Deleted

## 2014-10-07 ENCOUNTER — Institutional Professional Consult (permissible substitution): Payer: PRIVATE HEALTH INSURANCE | Admitting: Internal Medicine

## 2014-10-07 ENCOUNTER — Encounter: Payer: Self-pay | Admitting: Internal Medicine

## 2014-10-07 ENCOUNTER — Ambulatory Visit (INDEPENDENT_AMBULATORY_CARE_PROVIDER_SITE_OTHER): Payer: Medicare Other | Admitting: Internal Medicine

## 2014-10-07 VITALS — BP 150/90 | HR 66 | Temp 97.8°F | Ht 67.5 in | Wt 150.6 lb

## 2014-10-07 DIAGNOSIS — J45998 Other asthma: Secondary | ICD-10-CM | POA: Diagnosis not present

## 2014-10-07 MED ORDER — FLUTICASONE PROPIONATE HFA 44 MCG/ACT IN AERO: INHALATION_SPRAY | RESPIRATORY_TRACT | Status: AC

## 2014-10-07 NOTE — Patient Instructions (Signed)
Follow up with Dr. Stevenson Clinch in 3 months - we will refill your flovent  - please use flovent daily - 1 puff in the AM and 1 puff in the PM (10-12 hours apart) - rinse and gargle after each use.

## 2014-10-07 NOTE — Assessment & Plan Note (Signed)
Since patient has stopped her allergy vaccine, she stated overall she's been doing fairly well. However I'm concerned, that she is using her maintenance medication (Flovent, 44 mcg) as a rescue inhaler. I spent about 15 minutes reviewing the dosage, indication, proper usage, administration of Flovent to the patient, with her husband present in the room.  Patient now with mild intermittent asthma that is fairly well controlled, I have advised her to use Flovent 1 puff twice a day for maintenance of her allergy induced asthma.  Plan: -Flovent 31mcg - 1 puff twice a day daily. -Continue with allergy control -Stable asthma.

## 2014-10-07 NOTE — Telephone Encounter (Signed)
Called to reschedule appt scheduled with Dr. Stevenson Clinch today. She was asleep so I talked to her husband.  Per Dr. Stevenson Clinch patient has been seeing Dr. Annamaria Boots and was on an allergy regimen. Pt had decided to stop injections at last office visit 11/2012 and was to follow up in 1 year. Pt decided that she wanted to switch to [facility] office. Dr. Stevenson Clinch says patient needs to follow up one more time with Dr. Annamaria Boots to determine if she will go back on shots. She can then start to see him (Mungal) in the [place] office.

## 2014-10-07 NOTE — Telephone Encounter (Signed)
Patient called the [facility] office upset and said she was getting dressed and would be here for 3pm appointment. She was then added back to the schedule. Spoke with Joellen Jersey who says that patient has been off vaccine for almost 2 years. She says it is ok for patient to be seen for lung issues in [place] office.

## 2014-10-07 NOTE — Progress Notes (Signed)
MRN# [id number] [name] [birth year]   CC: Chief Complaint  Patient presents with  . Follow-up    Asthma f/u CY patient, patient states she uses Flovent three times per week. She has dropped all her medication and states she feels good.      Brief History: 11/23/11- 71 yoF passive smoker followed for asthma, allergy complicated by bipolar disorder, hypothyroid, HBP LOV-04/14/10 Remote history of eosinophilic pneumonia. Allergy vaccine still helps. She needs EpiPen refill. Infrequent need for rescue inhaler. Feeling very well now. Clinical Data: Cough with left lower lobe nodules on chest radiograph. 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  CT OF THE CHEST WITH CONTRAST: 09/28/04 Multidetector helical CT imaging was performed during the IV bolus injection of 100 cc Omnipaque 300. There are no chest radiographs available for correlation.  The lungs are clear without evidence of pulmonary nodule. There is no endobronchial lesion or confluent air space opacity. There is no pleural or pericardial effusion. No enlarged mediastinal or hilar lymph nodes are present. The visualized upper abdomen is unremarkable.  IMPRESSION:  Normal CT of the chest. No evidence of pulmonary nodule.  jst  Provider: Debbra Riding, Lennice Sites   11/28/12 71 yoF passive smoker followed for asthma, allergy complicated by bipolar disorder, hypothyroid, HBP follows for : no asthma flares at this time. synthroid makes her so sleepy and tired during the day. no refills needed today. Lost her husband/MI. Coping. Using rescue inhaler about twice a week. Dropped off of allergy vaccine because her husband was giving her shots. CXR 11/30/11 IMPRESSION:  No acute findings.  Original Report Authenticated By: Luretha Rued, M.D.          Events since last clinic visit: Patient presents today for follow-up visit, she is accompanied by her new husband. Patient is a history of asthma, allergy  related, previously receiving allergy vaccine by Dr. Keturah Barre, however over the last 2 years since been weaned off. Per her medication review she is supposed to be on Flovent 2 puffs twice a day however, she is using Flovent as a rescue inhaler at this time. She states that she has Flovent use intermittently, she last uses it one week ago for some mild cough and runny nose she was having at that time. Overall she states that she is doing fairly well, she still has some episodes of nasal drainage and congestion but only occurring during high allergy counts. She recently saw her primary care physician office, the nurse practitioner, and was referred back to the [facility] location for reestablish visit.       PMHX:   Past Medical History  Diagnosis Date  . Hypertension   . Hypercholesterolemia   . Osteoporosis   . Anxiety   . Hyperthyroidism   . Hypothyroid   . Bipolar disease, chronic   . Hyperlipidemia   . Osteoporosis   . Asthma   . GERD (gastroesophageal reflux disease)   . Elevated blood pressure     has good outpatient control.   . Normal nuclear stress test 2003   Surgical Hx:  Past Surgical History  Procedure Laterality Date  . Tonsillectomy    . Right foot and ankle    . Nasal polpectomy  04/1998    Done by Dr. Ernesto Rutherford  . Right achilles      Done @ [facility] by Dr. Para March  . Nasal sinus surgery  10/2000    Right frontal mucocle. Done by Dr. Caryn Section @ [facility]  Family Hx:  Family History  Problem Relation Age of Onset  . Stroke Father   . Dementia Father   . Breast cancer Maternal Aunt    Social Hx:   History  Substance Use Topics  . Smoking status: Passive Smoke Exposure - Never Smoker  . Smokeless tobacco: Never Used     Comment: husband smokes, but not in the house.  Marland Kitchen Alcohol Use: No     Comment: RARELY   Medication:   Current Outpatient Rx  Name  Route  Sig  Dispense  Refill  . cholecalciferol (VITAMIN D) 1000 UNITS tablet   Oral   Take  1,000 Units by mouth daily.         . fluticasone (FLOVENT HFA) 44 MCG/ACT inhaler      INHALE 2 PUFFS INTO THE LUNGS 2 (TWO) TIMES DAILY AS NEEDED.   1 Inhaler   prn   . Vitamin D, Ergocalciferol, (DRISDOL) 50000 UNITS CAPS capsule   Oral   Take 50,000 Units by mouth daily.            Review of Systems: Gen:  Denies  fever, sweats, chills HEENT: Mild sore throat, runny nose which is now improving. Cvc:  No dizziness, chest pain or heaviness Resp:   Denies cough or sputum porduction, shortness of breath Gi: Denies swallowing difficulty, stomach pain, nausea or vomiting, diarrhea, constipation, bowel incontinence Gu:  Denies bladder incontinence, burning urine Ext:   No Joint pain, stiffness or swelling Skin: No skin rash, easy bruising or bleeding or hives Endoc:  No polyuria, polydipsia , polyphagia or weight change Psych: No depression, insomnia or hallucinations  Other:  All other systems negative  Allergies:  Other; Lidocaine; and Pneumococcal vaccines  Physical Examination:  VS: BP 150/90 mmHg  Pulse 66  Temp(Src) 97.8 F (36.6 C) (Oral)  Ht 5' 7.5" (1.715 m)  Wt 150 lb 9.6 oz (68.312 kg)  BMI 23.23 kg/m2  SpO2 100%  General Appearance: No distress  HEENT: PERRLA, EOM intact, no ptosis, no other lesions noticed Pulmonary:Exam: normal breath sounds., diaphragmatic excursion normal.No wheezing, No rales   Cardiovascular:@ Exam:  Normal S1,S2.  No m/r/g.     Abdomen:Exam: Benign, Soft, non-tender, No masses  Skin:   warm, no rashes, no ecchymosis  Extremities: normal, no cyanosis, clubbing, no edema, warm with normal capillary refill.   Labs results:  BMP Lab Results  Component Value Date   NA 139 09/18/2011   K 4.0 09/18/2011   CL 107 09/18/2011   CO2 24 09/18/2011   GLUCOSE 101* 09/18/2011   BUN 7 09/18/2011   CREATININE 0.8 09/18/2011     CBC CBC Latest Ref Rng 06/21/2012 02/05/2012 09/18/2011  WBC 4.5 - 10.5 K/uL 8.2 8.4 13.1 Repeated and verified  X2.(H)  Hemoglobin 12.0 - 15.0 g/dL 13.2 12.7 13.6  Hematocrit 36.0 - 46.0 % 39.8 38.5 41.3  Platelets 150.0 - 400.0 K/uL 254.0 216.0 222.0    Assessment and Plan: 74 year old female with history of asthma, allergy related, doing fairly well seen in follow-up today. Allergic-infective asthma Since patient has stopped her allergy vaccine, she stated overall she's been doing fairly well. However I'm concerned, that she is using her maintenance medication (Flovent, 44 mcg) as a rescue inhaler. I spent about 15 minutes reviewing the dosage, indication, proper usage, administration of Flovent to the patient, with her husband present in the room.  Patient now with mild intermittent asthma that is fairly well controlled, I have advised her to  use Flovent 1 puff twice a day for maintenance of her allergy induced asthma.  Plan: -Flovent 70mcg - 1 puff twice a day daily. -Continue with allergy control -Stable asthma.      Updated Medication List Outpatient Encounter Prescriptions as of 10/07/2014  Medication Sig  . cholecalciferol (VITAMIN D) 1000 UNITS tablet Take 1,000 Units by mouth daily.  . fluticasone (FLOVENT HFA) 44 MCG/ACT inhaler INHALE 2 PUFFS INTO THE LUNGS 2 (TWO) TIMES DAILY AS NEEDED.  . Vitamin D, Ergocalciferol, (DRISDOL) 50000 UNITS CAPS capsule Take 50,000 Units by mouth daily.  . [DISCONTINUED] albuterol (PROAIR HFA) 108 (90 BASE) MCG/ACT inhaler 2 puffs every 4 hours as needed- rescue inhaler  . [DISCONTINUED] aspirin 81 MG tablet Take 81 mg by mouth 3 (three) times a week.    . [DISCONTINUED] EPINEPHrine (EPIPEN) 0.3 mg/0.3 mL DEVI Inject 0.3 mLs (0.3 mg total) into the muscle once.  . [DISCONTINUED] Fluticasone Propionate, Inhal, (FLOVENT IN) Inhale into the lungs as needed.    . [DISCONTINUED] labetalol (NORMODYNE) 100 MG tablet Take 100 mg by mouth 3 (three) times a week.    . [DISCONTINUED] levothyroxine (SYNTHROID, LEVOTHROID) 88 MCG tablet Take 88 mcg by mouth  daily.    . [DISCONTINUED] lithium (LITHOBID) 300 MG CR tablet 2 tablets by mouth at bedtime  . [DISCONTINUED] mometasone (NASONEX) 50 MCG/ACT nasal spray 2 sprays by Nasal route as needed.    . [DISCONTINUED] polyethylene glycol (MIRALAX / GLYCOLAX) packet Take 17 g by mouth daily.    . [DISCONTINUED] pravastatin (PRAVACHOL) 20 MG tablet TAKE 1 TABLET (20 MG TOTAL) BY MOUTH DAILY. (Patient not taking: Reported on 09/21/2014)  . [DISCONTINUED] QUEtiapine (SEROQUEL) 25 MG tablet Take 0.5 tablets (12.5 mg total) by mouth at bedtime. (Patient not taking: Reported on 09/21/2014)  . [DISCONTINUED] simvastatin (ZOCOR) 20 MG tablet Take 20 mg by mouth at bedtime.    . [DISCONTINUED] sulfamethoxazole-trimethoprim (BACTRIM DS,SEPTRA DS) 800-160 MG per tablet Take 1 tablet by mouth 2 (two) times daily.  . [DISCONTINUED] SYNTHROID 75 MCG tablet TAKE 1 TABLET BY MOUTH DAILY (Patient not taking: Reported on 09/21/2014)  . [DISCONTINUED] teriparatide (FORTEO) 750 MCG/3ML injection Inject 20 mcg into the skin daily.      Orders for this visit: No orders of the defined types were placed in this encounter.    Thank  you for the visitation and for allowing  [facility] to assist in the care of your patient. Our recommendations are noted above.  Please contact us if we can be of further service.  Vilinda Boehringer, MD [facility] Office Number: [phone]

## 2014-12-24 ENCOUNTER — Ambulatory Visit: Payer: PRIVATE HEALTH INSURANCE | Admitting: Nurse Practitioner

## 2014-12-24 DIAGNOSIS — Z0289 Encounter for other administrative examinations: Secondary | ICD-10-CM

## 2015-01-05 ENCOUNTER — Ambulatory Visit (INDEPENDENT_AMBULATORY_CARE_PROVIDER_SITE_OTHER): Payer: Medicare Other | Admitting: Internal Medicine

## 2015-01-05 ENCOUNTER — Encounter: Payer: Self-pay | Admitting: Internal Medicine

## 2015-01-05 VITALS — BP 136/80 | HR 78 | Temp 97.7°F | Ht 67.5 in | Wt 155.0 lb

## 2015-01-05 DIAGNOSIS — J45998 Other asthma: Secondary | ICD-10-CM

## 2015-01-05 MED ORDER — FLUTICASONE PROPIONATE HFA 44 MCG/ACT IN AERO: 2.0000 | INHALATION_SPRAY | Freq: Two times a day (BID) | RESPIRATORY_TRACT | Status: DC

## 2015-01-05 NOTE — Patient Instructions (Signed)
Follow up with Dr. Stevenson Clinch in 6 months - cont with Flovent - 1 puff twice a day, gargle and rinse after each use. May increase to 2 puffs twice a day during high allergy season or with worsening shortness of breath, wheezing, cough. - Continue with diet and exercise

## 2015-01-05 NOTE — Progress Notes (Signed)
MRN# [id number] [name] 1940/07/11   CC: Chief Complaint  Patient presents with  . Follow-up    Asthma f/u: Pt still using Flovent inhaler, she does not use it daily still.      Brief History: 11/23/11- 74 yoF passive smoker followed for asthma, allergy complicated by bipolar disorder, hypothyroid, HBP LOV-04/14/10 Remote history of eosinophilic pneumonia. Allergy vaccine still helps. She needs EpiPen refill. Infrequent need for rescue inhaler. Feeling very well now. Clinical Data: Cough with left lower lobe nodules on chest radiograph. 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  CT OF THE CHEST WITH CONTRAST: 09/28/04 Multidetector helical CT imaging was performed during the IV bolus injection of 100 cc Omnipaque 300. There are no chest radiographs available for correlation.  The lungs are clear without evidence of pulmonary nodule. There is no endobronchial lesion or confluent air space opacity. There is no pleural or pericardial effusion. No enlarged mediastinal or hilar lymph nodes are present. The visualized upper abdomen is unremarkable.  IMPRESSION:  Normal CT of the chest. No evidence of pulmonary nodule.  jst  Provider: Debbra Riding, Lennice Sites   11/28/12 74 yoF passive smoker followed for asthma, allergy complicated by bipolar disorder, hypothyroid, HBP follows for : no asthma flares at this time. synthroid makes her so sleepy and tired during the day. no refills needed today. Lost her husband/MI. Coping. Using rescue inhaler about twice a week. Dropped off of allergy vaccine because her husband was giving her shots. CXR 11/30/11 IMPRESSION:  No acute findings.  Original Report Authenticated By: Luretha Rued, M.D.              ROV 10/07/14 Patient presents today for follow-up visit, she is accompanied by her new husband. Patient is a history of asthma, allergy related, previously receiving allergy vaccine by Dr. Keturah Barre, however  over the last 2 years since been weaned off. Per her medication review she is supposed to be on Flovent 2 puffs twice a day however, she is using Flovent as a rescue inhaler at this time. She states that she has Flovent use intermittently, she last uses it one week ago for some mild cough and runny nose she was having at that time. Overall she states that she is doing fairly well, she still has some episodes of nasal drainage and congestion but only occurring during high allergy counts. She recently saw her primary care physician office, the nurse practitioner, and was referred back to the [facility] location for reestablish visit. Plan: -Flovent 16mcg - 1 puff twice a day daily. -Continue with allergy control -Stable asthma.  Events since last clinic visit: Patient presents today for a follow-up visit of asthma secondary to allergies, she is accompanied by her husband. At her last visit it was believed that she was using Flovent as a rescue inhaler, after counseling she was advised to be on maintenance Flovent 1 puff twice a day, which she has been using regularly and has had very well-controlled for symptoms now. Today she denies any significant shortness of breath/wheezing/cough/fever. She states she has a mild chronic runny nose for chronic sinusitis that is not impacting her overall health.     Medication:   Current Outpatient Rx  Name  Route  Sig  Dispense  Refill  . cholecalciferol (VITAMIN D) 1000 UNITS tablet   Oral   Take 1,000 Units by mouth daily.         . fluticasone (FLOVENT HFA) 44 MCG/ACT inhaler  INHALE 2 PUFFS INTO THE LUNGS 2 (TWO) TIMES DAILY AS NEEDED.   1 Inhaler   4   . fluticasone (FLOVENT HFA) 44 MCG/ACT inhaler   Inhalation   Inhale 2 puffs into the lungs 2 (two) times daily.   10.6 g   12      Review of Systems: Gen:  Denies  fever, sweats, chills HEENT: Denies blurred vision, double vision, ear pain, eye pain, hearing loss,  nose bleeds, sore throat Cvc:  No dizziness, chest pain or heaviness Resp:   Admits to: Mild runny nose, clear Gi: Denies swallowing difficulty, stomach pain, nausea or vomiting, diarrhea, constipation, bowel incontinence Gu:  Denies bladder incontinence, burning urine Ext:   No Joint pain, stiffness or swelling Skin: No skin rash, easy bruising or bleeding or hives Endoc:  No polyuria, polydipsia , polyphagia or weight change Other:  All other systems negative  Allergies:  Other; Lidocaine; and Pneumococcal vaccines  Physical Examination:  VS: BP 136/80 mmHg  Pulse 78  Temp(Src) 97.7 F (36.5 C) (Oral)  Ht 5' 7.5" (1.715 m)  Wt 155 lb (70.308 kg)  BMI 23.90 kg/m2  SpO2 100%  General Appearance: No distress  HEENT: PERRLA, no ptosis, no other lesions noticed Pulmonary:normal breath sounds., diaphragmatic excursion normal.No wheezing, No rales   Cardiovascular:  Normal S1,S2.  No m/r/g.     Abdomen:Exam: Benign, Soft, non-tender, No masses  Skin:   warm, no rashes, no ecchymosis  Extremities: normal, no cyanosis, clubbing, warm with normal capillary refill.     Assessment and Plan: 74 year old female seen in consultation follow-up for asthma Allergic-infective asthma Since patient has stopped her allergy vaccine, she stated overall she's been doing fairly well. Patient states since using Flovent 1 puff twice a day, his symptoms have been very well-controlled, she only has a mild runny nose which is chronic from chronic sinusitis otherwise she has significant improvement in her clinical outcomes.  Patient now with mild intermittent asthma that is  well controlled, I have advised her to use Flovent 1 puff twice a day for maintenance of her allergy induced asthma.  Plan: -Flovent 51mcg - 1 puff twice a day daily.  May increase to 2 puffs twice a day during high allergy season or with increasing cough/wheezing,/shortness of breath -Continue with allergy control -Stable  asthma.       Updated Medication List Outpatient Encounter Prescriptions as of 01/05/2015  Medication Sig  . cholecalciferol (VITAMIN D) 1000 UNITS tablet Take 1,000 Units by mouth daily.  . fluticasone (FLOVENT HFA) 44 MCG/ACT inhaler INHALE 2 PUFFS INTO THE LUNGS 2 (TWO) TIMES DAILY AS NEEDED.  . [DISCONTINUED] Vitamin D, Ergocalciferol, (DRISDOL) 50000 UNITS CAPS capsule Take 50,000 Units by mouth daily.   No facility-administered encounter medications on file as of 01/05/2015.    Orders for this visit: No orders of the defined types were placed in this encounter.    Thank  you for the visitation and for allowing  [facility] to assist in the care of your patient. Our recommendations are noted above.  Please contact us if we can be of further service.  Vilinda Boehringer, MD [facility] Office Number: [phone]

## 2015-01-05 NOTE — Assessment & Plan Note (Signed)
Since patient has stopped her allergy vaccine, she stated overall she's been doing fairly well. Patient states since using Flovent 1 puff twice a day, his symptoms have been very well-controlled, she only has a mild runny nose which is chronic from chronic sinusitis otherwise she has significant improvement in her clinical outcomes.  Patient now with mild intermittent asthma that is  well controlled, I have advised her to use Flovent 1 puff twice a day for maintenance of her allergy induced asthma.  Plan: -Flovent 63mcg - 1 puff twice a day daily.  May increase to 2 puffs twice a day during high allergy season or with increasing cough/wheezing,/shortness of breath -Continue with allergy control -Stable asthma.

## 2015-02-02 ENCOUNTER — Ambulatory Visit (INDEPENDENT_AMBULATORY_CARE_PROVIDER_SITE_OTHER): Payer: Medicare Other | Admitting: Nurse Practitioner

## 2015-02-02 VITALS — BP 170/100 | HR 69 | Temp 98.0°F | Resp 16 | Ht 67.5 in | Wt 160.4 lb

## 2015-02-02 DIAGNOSIS — F419 Anxiety disorder, unspecified: Secondary | ICD-10-CM | POA: Diagnosis not present

## 2015-02-02 MED ORDER — AMLODIPINE BESYLATE 5 MG PO TABS: 5.0000 mg | ORAL_TABLET | Freq: Every day | ORAL | Status: DC

## 2015-02-02 NOTE — Progress Notes (Signed)
Patient ID: [name], female    DOB: [birth year]  Age: 75 y.o. MRN: [id number]  CC: Anxiety   HPI [name] presents for anxiety when riding in the car. She is accompanied by her husband today.   1) Pt reports anxiety when being in the car when her husband drives. She reports anxiety at 30-35 mph specifically. She feels it is too fast. Her husband's daughter is critically ill and they will be traveling to [place]. Pt and husband report no time frame for travel is specified. Husband had a contact that told him to tell us to write for Valium or Xanax.   History [name] has a past medical history of Hypertension; Hypercholesterolemia; Osteoporosis; Anxiety; Hyperthyroidism; Hypothyroid; Bipolar disease, chronic; Hyperlipidemia; Osteoporosis; Asthma; GERD (gastroesophageal reflux disease); Elevated blood pressure; and Normal nuclear stress test (2003).   She has past surgical history that includes Tonsillectomy; right foot and ankle; nasal polpectomy (04/1998); right achilles; and Nasal sinus surgery (10/2000).   Her family history includes Breast cancer in her maternal aunt; Dementia in her father; Stroke in her father.She reports that she has been passively smoking.  She has never used smokeless tobacco. She reports that she does not drink alcohol or use illicit drugs.  Outpatient Prescriptions Prior to Visit  Medication Sig Dispense Refill  . cholecalciferol (VITAMIN D) 1000 UNITS tablet Take 1,000 Units by mouth daily.    . fluticasone (FLOVENT HFA) 44 MCG/ACT inhaler INHALE 2 PUFFS INTO THE LUNGS 2 (TWO) TIMES DAILY AS NEEDED. 1 Inhaler 4  . fluticasone (FLOVENT HFA) 44 MCG/ACT inhaler Inhale 2 puffs into the lungs 2 (two) times daily. 10.6 g 12   No facility-administered medications prior to visit.    ROS Review of Systems  Constitutional: Negative for fever, chills, diaphoresis and fatigue.  Psychiatric/Behavioral: The patient is nervous/anxious.     Objective:  BP 170/100  mmHg  Pulse 69  Temp(Src) 98 F (36.7 C)  Resp 16  Ht 5' 7.5" (1.715 m)  Wt 160 lb 6.4 oz (72.757 kg)  BMI 24.74 kg/m2  SpO2 98%  Physical Exam  Constitutional: She is oriented to person, place, and time. She appears well-developed and well-nourished. No distress.  HENT:  Head: Normocephalic and atraumatic.  Right Ear: External ear normal.  Left Ear: External ear normal.  Eyes: EOM are normal. Pupils are equal, round, and reactive to light. Right eye exhibits no discharge. Left eye exhibits no discharge. No scleral icterus.  Neurological: She is alert and oriented to person, place, and time. No cranial nerve deficit. She exhibits normal muscle tone. Coordination normal.  Skin: Skin is warm and dry. No rash noted. She is not diaphoretic.  Psychiatric: She has a normal mood and affect. Her behavior is normal. Judgment and thought content normal.   Assessment & Plan:   [name] was seen today for anxiety.  Diagnoses and all orders for this visit:  Anxiety  Other orders -     amLODipine (NORVASC) 5 MG tablet; Take 1 tablet (5 mg total) by mouth daily.  I am having [name] start on amLODipine. I am also having her maintain her cholecalciferol and fluticasone.  Meds ordered this encounter  Medications  . amLODipine (NORVASC) 5 MG tablet    Sig: Take 1 tablet (5 mg total) by mouth daily.    Dispense:  30 tablet    Refill:  0    Order Specific Question:  Supervising Provider    Answer:  Deborra Medina L [[id number]]  Follow-up: Return in about 2 weeks (around 02/16/2015) for HTN.

## 2015-02-02 NOTE — Progress Notes (Signed)
Pre visit review using our clinic review tool, if applicable. No additional management support is needed unless otherwise documented below in the visit note. 

## 2015-02-02 NOTE — Patient Instructions (Signed)
Benadryl 2 tablets 20-30 min before going riding in the car.   Amlodipine for your blood pressure and follow up in 2 weeks.

## 2015-02-05 ENCOUNTER — Encounter: Payer: Self-pay | Admitting: Nurse Practitioner

## 2015-02-05 NOTE — Assessment & Plan Note (Signed)
Pt has extensive psychiatric history. Referral was placed in March for psychiatry and she went once and report she is not going back. Due to this history and lack of follow up with myself, benzos are not recommended. Benadryl 50 mg before travel recommended. FU in 2 weeks.

## 2015-03-01 ENCOUNTER — Other Ambulatory Visit: Payer: Self-pay | Admitting: Nurse Practitioner

## 2015-04-15 ENCOUNTER — Encounter: Payer: Self-pay | Admitting: Nurse Practitioner

## 2015-05-23 DIAGNOSIS — Z23 Encounter for immunization: Secondary | ICD-10-CM | POA: Diagnosis not present

## 2015-08-05 ENCOUNTER — Encounter (INDEPENDENT_AMBULATORY_CARE_PROVIDER_SITE_OTHER): Payer: Self-pay

## 2015-08-05 ENCOUNTER — Ambulatory Visit (INDEPENDENT_AMBULATORY_CARE_PROVIDER_SITE_OTHER): Payer: Medicare Other | Admitting: Internal Medicine

## 2015-08-05 VITALS — BP 160/90 | HR 85

## 2015-08-05 DIAGNOSIS — J45998 Other asthma: Secondary | ICD-10-CM | POA: Diagnosis not present

## 2015-08-05 NOTE — Patient Instructions (Addendum)
Follow up with Dr. Stevenson Clinch in:6 months - cont with exercise and diet - cont with flovent as prescribed- may use one puff twice a day at the beginning of allergy season, can increase to 2 puffs twice a day during high allergy season if needed.

## 2015-08-05 NOTE — Assessment & Plan Note (Signed)
Since patient has stopped her allergy vaccine, she stated overall she's been doing fairly well. Patient previously using Flovent 1 puff twice a day in the last 8 months she has subsequently weaned herself and is no longer requiring Flovent at this time. I did advise her that as allergy season comes around she may need to restart her Flovent use.  Patient now with mild intermittent asthma that is  well controlled.  Plan: -Flovent 62mcg - 1 puff twice a day daily during allergy season .  May increase to 2 puffs twice a day during high allergy season or with increasing cough/wheezing,/shortness of breath -Continue with allergy control -Stable asthma.

## 2015-08-05 NOTE — Progress Notes (Signed)
MRN# [id number] [name] Apr 04, 1941   CC: Chief Complaint  Patient presents with  . Follow-up    pt. states breathing is baseline. denies SOB, cough, wheezing or chest pain/tightness.       Brief History: 11/23/11- 71 yoF passive smoker followed for asthma, allergy complicated by bipolar disorder, hypothyroid, HBP LOV-04/14/10 Remote history of eosinophilic pneumonia. Allergy vaccine still helps. She needs EpiPen refill. Infrequent need for rescue inhaler. Feeling very well now. Clinical Data: Cough with left lower lobe nodules on chest radiograph. 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  CT OF THE CHEST WITH CONTRAST: 09/28/04 Multidetector helical CT imaging was performed during the IV bolus injection of 100 cc Omnipaque 300. There are no chest radiographs available for correlation.  The lungs are clear without evidence of pulmonary nodule. There is no endobronchial lesion or confluent air space opacity. There is no pleural or pericardial effusion. No enlarged mediastinal or hilar lymph nodes are present. The visualized upper abdomen is unremarkable.  IMPRESSION:  Normal CT of the chest. No evidence of pulmonary nodule.  jst  Provider: Debbra Riding, Lennice Sites   11/28/12 71 yoF passive smoker followed for asthma, allergy complicated by bipolar disorder, hypothyroid, HBP follows for : no asthma flares at this time. synthroid makes her so sleepy and tired during the day. no refills needed today. Lost her husband/MI. Coping. Using rescue inhaler about twice a week. Dropped off of allergy vaccine because her husband was giving her shots. CXR 11/30/11 IMPRESSION:  No acute findings.  Original Report Authenticated By: Luretha Rued, M.D.              ROV 10/07/14 Patient presents today for follow-up visit, she is accompanied by her new husband. Patient is a history of asthma, allergy related, previously receiving allergy vaccine by Dr. Keturah Barre,  however over the last 2 years since been weaned off. Per her medication review she is supposed to be on Flovent 2 puffs twice a day however, she is using Flovent as a rescue inhaler at this time. She states that she has Flovent use intermittently, she last uses it one week ago for some mild cough and runny nose she was having at that time. Overall she states that she is doing fairly well, she still has some episodes of nasal drainage and congestion but only occurring during high allergy counts. She recently saw her primary care physician office, the nurse practitioner, and was referred back to the [facility] location for reestablish visit. Plan: -Flovent 50mcg - 1 puff twice a day daily. -Continue with allergy control -Stable asthma.  Events since last clinic visit: Patient presents today for a follow-up visit of asthma secondary to allergies, she is accompanied by her husband. At her last visit she was educated and counseled on proper use of her maintenance inhaler, Flovent. She was using 1 puff twice a day, since that last visit she has weaned herself off of Flovent, and is currently not requiring any use of rescue maintenance inhaler. She denies any cough, sinus congestion, wheezing, shortness of breath, fever, chills. Overall she states she is doing well, with no significant pulmonary/respiratory issues at today's visit.   Medication:   Current Outpatient Rx  Name  Route  Sig  Dispense  Refill  . amLODipine (NORVASC) 5 MG tablet      TAKE 1 TABLET (5 MG TOTAL) BY MOUTH DAILY.   30 tablet   1   . cholecalciferol (VITAMIN D) 1000 UNITS  tablet   Oral   Take 1,000 Units by mouth daily.         . fluticasone (FLOVENT HFA) 44 MCG/ACT inhaler      INHALE 2 PUFFS INTO THE LUNGS 2 (TWO) TIMES DAILY AS NEEDED.   1 Inhaler   4      Review of Systems: Gen:  Denies  fever, sweats, chills HEENT: Denies blurred vision, double vision, ear pain, eye pain, hearing loss, nose  bleeds, sore throat Cvc:  No dizziness, chest pain or heaviness Resp:   Denies sob, doe, cough, wheezing.  Gi: Denies swallowing difficulty, stomach pain, nausea or vomiting, diarrhea, constipation, bowel incontinence Gu:  Denies bladder incontinence, burning urine Ext:   No Joint pain, stiffness or swelling Skin: No skin rash, easy bruising or bleeding or hives Endoc:  No polyuria, polydipsia , polyphagia or weight change Other:  All other systems negative  Allergies:  Other; Lidocaine; and Pneumococcal vaccines  Physical Examination:  VS: BP 160/90 mmHg  Pulse 85  SpO2 97%  General Appearance: No distress  HEENT: PERRLA, no ptosis, no other lesions noticed Pulmonary:normal breath sounds., diaphragmatic excursion normal.No wheezing, No rales   Cardiovascular:  Normal S1,S2.  No m/r/g.     Abdomen:Exam: Benign, Soft, non-tender, No masses  Skin:   warm, no rashes, no ecchymosis  Extremities: normal, no cyanosis, clubbing, warm with normal capillary refill.     Assessment and Plan: 75 year old female seen in consultation follow-up for asthma Allergic-infective asthma Since patient has stopped her allergy vaccine, she stated overall she's been doing fairly well. Patient previously using Flovent 1 puff twice a day in the last 8 months she has subsequently weaned herself and is no longer requiring Flovent at this time. I did advise her that as allergy season comes around she may need to restart her Flovent use.  Patient now with mild intermittent asthma that is  well controlled.  Plan: -Flovent 14mcg - 1 puff twice a day daily during allergy season .  May increase to 2 puffs twice a day during high allergy season or with increasing cough/wheezing,/shortness of breath -Continue with allergy control -Stable asthma.         Updated Medication List Outpatient Encounter Prescriptions as of 08/05/2015  Medication Sig  . amLODipine (NORVASC) 5 MG tablet TAKE 1 TABLET (5 MG TOTAL)  BY MOUTH DAILY.  . cholecalciferol (VITAMIN D) 1000 UNITS tablet Take 1,000 Units by mouth daily.  . fluticasone (FLOVENT HFA) 44 MCG/ACT inhaler INHALE 2 PUFFS INTO THE LUNGS 2 (TWO) TIMES DAILY AS NEEDED.   No facility-administered encounter medications on file as of 08/05/2015.    Orders for this visit: No orders of the defined types were placed in this encounter.    Thank  you for the visitation and for allowing  [facility] to assist in the care of your patient. Our recommendations are noted above.  Please contact us if we can be of further service.  Vilinda Boehringer, MD [facility] Office Number: [phone]

## 2015-10-31 ENCOUNTER — Emergency Department (HOSPITAL_COMMUNITY): Payer: Medicare Other

## 2015-10-31 ENCOUNTER — Emergency Department (HOSPITAL_COMMUNITY)
Admission: EM | Admit: 2015-10-31 | Discharge: 2015-11-01 | Disposition: A | Discharge status: Home / Self Care | Payer: Medicare Other | Attending: Emergency Medicine | Admitting: Emergency Medicine

## 2015-10-31 ENCOUNTER — Encounter (HOSPITAL_COMMUNITY): Payer: Self-pay

## 2015-10-31 DIAGNOSIS — T7411XA Adult physical abuse, confirmed, initial encounter: Secondary | ICD-10-CM | POA: Diagnosis not present

## 2015-10-31 DIAGNOSIS — M810 Age-related osteoporosis without current pathological fracture: Secondary | ICD-10-CM | POA: Diagnosis not present

## 2015-10-31 DIAGNOSIS — R4182 Altered mental status, unspecified: Secondary | ICD-10-CM | POA: Diagnosis not present

## 2015-10-31 DIAGNOSIS — R410 Disorientation, unspecified: Secondary | ICD-10-CM | POA: Insufficient documentation

## 2015-10-31 DIAGNOSIS — T7601XA Adult neglect or abandonment, suspected, initial encounter: Secondary | ICD-10-CM | POA: Diagnosis not present

## 2015-10-31 DIAGNOSIS — IMO0001 Reserved for inherently not codable concepts without codable children: Secondary | ICD-10-CM

## 2015-10-31 DIAGNOSIS — J45909 Unspecified asthma, uncomplicated: Secondary | ICD-10-CM | POA: Diagnosis not present

## 2015-10-31 DIAGNOSIS — Z8659 Personal history of other mental and behavioral disorders: Secondary | ICD-10-CM | POA: Diagnosis not present

## 2015-10-31 DIAGNOSIS — Z8639 Personal history of other endocrine, nutritional and metabolic disease: Secondary | ICD-10-CM | POA: Diagnosis not present

## 2015-10-31 DIAGNOSIS — Z8719 Personal history of other diseases of the digestive system: Secondary | ICD-10-CM | POA: Diagnosis not present

## 2015-10-31 DIAGNOSIS — T7491XA Unspecified adult maltreatment, confirmed, initial encounter: Secondary | ICD-10-CM

## 2015-10-31 DIAGNOSIS — I10 Essential (primary) hypertension: Secondary | ICD-10-CM | POA: Diagnosis not present

## 2015-10-31 DIAGNOSIS — F0390 Unspecified dementia without behavioral disturbance: Secondary | ICD-10-CM | POA: Insufficient documentation

## 2015-10-31 DIAGNOSIS — Z79899 Other long term (current) drug therapy: Secondary | ICD-10-CM | POA: Diagnosis not present

## 2015-10-31 DIAGNOSIS — R030 Elevated blood-pressure reading, without diagnosis of hypertension: Secondary | ICD-10-CM | POA: Diagnosis not present

## 2015-10-31 LAB — URINALYSIS, ROUTINE W REFLEX MICROSCOPIC
BILIRUBIN URINE: NEGATIVE
GLUCOSE, UA: NEGATIVE mg/dL
Hgb urine dipstick: NEGATIVE
KETONES UR: NEGATIVE mg/dL
Nitrite: NEGATIVE
PH: 6.5 (ref 5.0–8.0)
Protein, ur: NEGATIVE mg/dL
SPECIFIC GRAVITY, URINE: 1.008 (ref 1.005–1.030)

## 2015-10-31 LAB — URINE MICROSCOPIC-ADD ON

## 2015-10-31 LAB — CBG MONITORING, ED: Glucose-Capillary: 112 mg/dL — ABNORMAL HIGH (ref 65–99)
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
## 2015-11-01 DIAGNOSIS — T7411XA Adult physical abuse, confirmed, initial encounter: Secondary | ICD-10-CM | POA: Diagnosis not present

## 2015-11-01 LAB — I-STAT TROPONIN, ED: TROPONIN I, POC: 0 ng/mL (ref 0.00–0.08)

## 2015-11-01 NOTE — Discharge Instructions (Signed)
Elder Abuse and Neglect Elder abuse and neglect refers to any way in which someone harms an older person or takes advantage of him or her. It also includes neglecting to protect an older person from harm.  Seek help immediately if you are being abused, if an older adult you know shows signs of abuse and neglect, or if you see anything that does not seem right.  WHAT ARE THE DIFFERENT KINDS OF ABUSE AND NEGLECT?   Physical abuse. This includes rough handling, threats with a weapon, throwing objects, pushing, grabbing, hitting, slapping, kicking, force-feeding, and improper use of restraints or medicines.  Sexual abuse. This involves sexual contact that is forced or tricked.  Emotional abuse. This includes verbal attacks, rejection, humiliation, intimidation, social isolation, or threats that belittle or create fear, distress, and anxiety. Abuse may also include denying the victim participation in decisions that affect his or her life.  Financial abuse. This includes theft, fraud, and inappropriate use of influence to gain control over an older person's money or property. Older persons may be coerced or manipulated to give away their money, rewrite their wills, give up control of their finances, assign durable power of attorney, or sign away ownership of their property. This is the most common form of elder abuse.  Neglect. This is a caretaker's failure or refusal to meet the needs of an older person. This can include not providing food, shelter, clothing, means for personal hygiene, medical care, and social stimulation, or complete abandonment. Neglect often overlaps with other kinds of abuse. WHAT ARE THE RISK FACTORS FOR ABUSE AND NEGLECT? Elder abuse occurs at every social and economic level. It occurs in all ethnic, racial, and religious groups. Abuse often occurs over a long period of time. It typically does not stop on its own. Elder abuse may be more likely to occur if:   The older person is  alone, isolated, or lonely.  The older person experienced a recent loss.  The older person is mentally or physically disabled.  The older person cannot manage his or her own financial or personal care.  The older person has contact with someone who abuses alcohol or drugs.  The older person is over 45 years old. The frequency of abuse increases with advancing age.  The caregiver is under great stress. This may be due to financial, marital, or work problems, a recent decline in health, or loss of a loved one.  The caregiver has a history of mental illness, such as depression, or mental disability.  The caregiver abuses alcohol or other drugs.  The older person's needs exceed the caregiver's abilities, leading to frustration and stress.  The care facility where the older person lives has a shortage of beds, too many patients, or not enough staff. HOW DO I KNOW IF SOMEONE IS BEING ABUSED OR NEGLECTED? Someone may be being abused or neglected if the person:  Has physical signs of abuse, such as:  Burns.  Scars.  Bruises.  Broken bones.  Sores.  Rashes.  Weight loss.  Injury to the genitals.  Difficulty walking.  Other symptoms that can be prevented with proper care.  Does not have necessary medical supplies or personal care items such as medicines or eyeglasses.  Has worsening health or dementia, despite a treatment plan.  Shows behavioral signs of abuse or neglect. The older person may:  Move away from or seem afraid of a caregiver.  Withdraw socially.  Become violent or agitated.  Seem depressed.  Have nightmares  or difficulty sleeping.  Rock back and forth.  Lives in an unhealthy or unsafe environment.  Shows signs of not being cared for in an assisted living environment, such as poor hygiene or dirty clothes. It can be hard to know whether the changes you see in an older person are related to aging or illness or are signs of abuse and neglect. For  these reasons, abuse may be difficult to diagnose correctly. HOW CAN ELDER ABUSE BE PREVENTED?  If you are a family member or a friend:  Make an effort to see the person regularly. Frequent visits are the best way to determine if the person shows signs of abuse or neglect, or if his or her general well-being is declining. If the person lives in a long-term care facility, get to know the staff and express any concerns you may have right away.  Encourage the person to remain active and not isolated.  Offer to accompany the person to medical appointments, financial institutions, and other important meetings. If you are an older person:   Remain active, avoid isolation, and cultivate a strong support network of family and friends.  Be active in taking care of your health problems. Do what you can to remain healthy. This lessens vulnerability and dependency.  Refuse to allow anyone, even close relatives, to add his or her name to your bank accounts without your clear consent.  Never make important financial decisions under pressure. Avoid signing documents or transferring money or property to anyone without first getting legal advice.  Ask for professional help right away if you need help managing your health care, daily living, or finances.  Assert your right to be treated with respect and dignity. Be clear and vocal about what you will and will not tolerate. Set boundaries and insist that caregivers respect them.  Avoid denial. Ask for help if you need it and do not delay.  Consider asking two of your most trustworthy friends or family members to work together to represent your wishes in the event that you are not able to. WHAT SHOULD I DO IF I THINK SOMEONE IS BEING ABUSED OR NEGLECTED?  If you believe that you or someone you know is in immediate danger, call 911. If you or someone you know is being abused or neglected, contact:  A health care provider. Doctors, nurses, and other health  care providers are required to report abuse or neglect.  The police. Report your concerns to the local police station.  Adult Protective Services (APS). This state agency is usually part of social services or a Department of [employer].  An ombudsman. This is a person or service you can contact to discuss neglect or abuse claims in long-term care. You can find a long-term care ombudsman at VoidLink.com.br  Medicaid and Medicare fraud units. These teams check into cases of health, medical, and financial abuse or neglect. To find additional resources in your community, call the IT trainer at (205)669-6158 or visit their website at https://www.webster-tanner.com/  WHAT ARE THE [place] SOMEONE WHO IS [place]? Treatment depends on the type of abuse or neglect. It may include:  Medical treatment. Injuries from abuse or neglect might require medical attention.  Support groups.  Counseling.  Financial guidance to help a victim of financial abuse manage money or property. WHERE CAN I GET MORE INFORMATION?  [place] on Elder Abuse: http://townsend.com/  [employer] for the Prevention of Elder Abuse: http://www.preventelderabuse.org  Your local  health department, medical center, hospital, or other social service providers. They can refer you to an organization that provides specific services to help.   This information is not intended to replace advice given to you by your health care provider. Make sure you discuss any questions you have with your health care provider.   Document Released: 03/08/2005 Document Revised: 03/03/2015 Document Reviewed: 09/05/2013 Elsevier Interactive Patient Education [employer].

## 2015-11-02 LAB — URINE CULTURE

## 2015-12-08 DIAGNOSIS — R413 Other amnesia: Secondary | ICD-10-CM | POA: Diagnosis not present

## 2015-12-08 DIAGNOSIS — G301 Alzheimer's disease with late onset: Secondary | ICD-10-CM | POA: Diagnosis not present

## 2015-12-08 DIAGNOSIS — F0280 Dementia in other diseases classified elsewhere without behavioral disturbance: Secondary | ICD-10-CM | POA: Diagnosis not present

## 2015-12-08 DIAGNOSIS — J4520 Mild intermittent asthma, uncomplicated: Secondary | ICD-10-CM | POA: Diagnosis not present

## 2016-01-05 DIAGNOSIS — R4189 Other symptoms and signs involving cognitive functions and awareness: Secondary | ICD-10-CM | POA: Diagnosis not present

## 2016-01-05 DIAGNOSIS — J45998 Other asthma: Secondary | ICD-10-CM | POA: Diagnosis not present

## 2016-01-05 DIAGNOSIS — F320 Major depressive disorder, single episode, mild: Secondary | ICD-10-CM | POA: Diagnosis not present

## 2016-01-06 DIAGNOSIS — Z1329 Encounter for screening for other suspected endocrine disorder: Secondary | ICD-10-CM | POA: Diagnosis not present

## 2016-01-06 DIAGNOSIS — R4189 Other symptoms and signs involving cognitive functions and awareness: Secondary | ICD-10-CM | POA: Diagnosis not present

## 2016-01-11 DIAGNOSIS — E038 Other specified hypothyroidism: Secondary | ICD-10-CM | POA: Diagnosis not present

## 2016-01-26 DIAGNOSIS — G478 Other sleep disorders: Secondary | ICD-10-CM | POA: Diagnosis not present

## 2016-02-22 DIAGNOSIS — E559 Vitamin D deficiency, unspecified: Secondary | ICD-10-CM | POA: Diagnosis not present

## 2016-02-22 DIAGNOSIS — E039 Hypothyroidism, unspecified: Secondary | ICD-10-CM | POA: Diagnosis not present

## 2016-02-23 DIAGNOSIS — J45998 Other asthma: Secondary | ICD-10-CM | POA: Diagnosis not present

## 2016-02-23 DIAGNOSIS — F320 Major depressive disorder, single episode, mild: Secondary | ICD-10-CM | POA: Diagnosis not present

## 2016-02-23 DIAGNOSIS — E038 Other specified hypothyroidism: Secondary | ICD-10-CM | POA: Diagnosis not present

## 2016-03-22 DIAGNOSIS — M79671 Pain in right foot: Secondary | ICD-10-CM | POA: Diagnosis not present

## 2016-04-22 DIAGNOSIS — Z23 Encounter for immunization: Secondary | ICD-10-CM | POA: Diagnosis not present

## 2016-05-02 ENCOUNTER — Other Ambulatory Visit: Payer: Self-pay | Admitting: Internal Medicine

## 2016-05-02 DIAGNOSIS — Z1231 Encounter for screening mammogram for malignant neoplasm of breast: Secondary | ICD-10-CM

## 2016-05-10 DIAGNOSIS — J45998 Other asthma: Secondary | ICD-10-CM | POA: Diagnosis not present

## 2016-05-10 DIAGNOSIS — F0280 Dementia in other diseases classified elsewhere without behavioral disturbance: Secondary | ICD-10-CM | POA: Diagnosis not present

## 2016-05-10 DIAGNOSIS — F320 Major depressive disorder, single episode, mild: Secondary | ICD-10-CM | POA: Diagnosis not present

## 2016-05-10 DIAGNOSIS — G4709 Other insomnia: Secondary | ICD-10-CM | POA: Diagnosis not present

## 2016-07-06 ENCOUNTER — Ambulatory Visit: Payer: Medicare Other

## 2016-08-09 DIAGNOSIS — R600 Localized edema: Secondary | ICD-10-CM | POA: Diagnosis not present

## 2016-08-09 DIAGNOSIS — E038 Other specified hypothyroidism: Secondary | ICD-10-CM | POA: Diagnosis not present

## 2016-08-09 DIAGNOSIS — B372 Candidiasis of skin and nail: Secondary | ICD-10-CM | POA: Diagnosis not present

## 2016-08-10 DIAGNOSIS — I10 Essential (primary) hypertension: Secondary | ICD-10-CM | POA: Diagnosis not present

## 2016-08-10 DIAGNOSIS — E039 Hypothyroidism, unspecified: Secondary | ICD-10-CM | POA: Diagnosis not present

## 2016-10-11 DIAGNOSIS — B354 Tinea corporis: Secondary | ICD-10-CM | POA: Diagnosis not present

## 2016-10-17 DIAGNOSIS — E038 Other specified hypothyroidism: Secondary | ICD-10-CM | POA: Diagnosis not present

## 2016-10-25 DIAGNOSIS — R2241 Localized swelling, mass and lump, right lower limb: Secondary | ICD-10-CM | POA: Diagnosis not present

## 2016-10-25 DIAGNOSIS — M7989 Other specified soft tissue disorders: Secondary | ICD-10-CM | POA: Diagnosis not present

## 2016-10-25 DIAGNOSIS — M25471 Effusion, right ankle: Secondary | ICD-10-CM | POA: Diagnosis not present

## 2016-11-30 DIAGNOSIS — E039 Hypothyroidism, unspecified: Secondary | ICD-10-CM | POA: Diagnosis not present

## 2017-01-15 ENCOUNTER — Ambulatory Visit
Admission: RE | Admit: 2017-01-15 | Discharge: 2017-01-15 | Disposition: A | Discharge status: Home / Self Care | Payer: Medicare Other | Source: Ambulatory Visit | Attending: Internal Medicine | Admitting: Internal Medicine

## 2017-01-15 DIAGNOSIS — Z1231 Encounter for screening mammogram for malignant neoplasm of breast: Secondary | ICD-10-CM

## 2017-01-17 ENCOUNTER — Other Ambulatory Visit: Payer: Self-pay | Admitting: Internal Medicine

## 2017-01-17 DIAGNOSIS — R928 Other abnormal and inconclusive findings on diagnostic imaging of breast: Secondary | ICD-10-CM

## 2017-02-28 DIAGNOSIS — R630 Anorexia: Secondary | ICD-10-CM | POA: Diagnosis not present

## 2017-02-28 DIAGNOSIS — H1031 Unspecified acute conjunctivitis, right eye: Secondary | ICD-10-CM | POA: Diagnosis not present

## 2017-03-07 DIAGNOSIS — F0280 Dementia in other diseases classified elsewhere without behavioral disturbance: Secondary | ICD-10-CM | POA: Diagnosis not present

## 2017-03-07 DIAGNOSIS — H1031 Unspecified acute conjunctivitis, right eye: Secondary | ICD-10-CM | POA: Diagnosis not present

## 2017-03-07 DIAGNOSIS — E038 Other specified hypothyroidism: Secondary | ICD-10-CM | POA: Diagnosis not present

## 2017-03-08 DIAGNOSIS — E039 Hypothyroidism, unspecified: Secondary | ICD-10-CM | POA: Diagnosis not present

## 2017-03-08 DIAGNOSIS — I10 Essential (primary) hypertension: Secondary | ICD-10-CM | POA: Diagnosis not present

## 2017-03-08 DIAGNOSIS — D649 Anemia, unspecified: Secondary | ICD-10-CM | POA: Diagnosis not present

## 2017-03-09 DIAGNOSIS — R404 Transient alteration of awareness: Secondary | ICD-10-CM | POA: Diagnosis not present

## 2017-03-09 DIAGNOSIS — R740 Nonspecific elevation of levels of transaminase and lactic acid dehydrogenase [LDH]: Secondary | ICD-10-CM | POA: Diagnosis not present

## 2017-03-09 DIAGNOSIS — F0280 Dementia in other diseases classified elsewhere without behavioral disturbance: Secondary | ICD-10-CM | POA: Diagnosis not present

## 2017-03-09 DIAGNOSIS — G4700 Insomnia, unspecified: Secondary | ICD-10-CM | POA: Diagnosis not present

## 2017-03-09 DIAGNOSIS — R140 Abdominal distension (gaseous): Secondary | ICD-10-CM | POA: Diagnosis not present

## 2017-03-09 DIAGNOSIS — R4182 Altered mental status, unspecified: Secondary | ICD-10-CM | POA: Diagnosis not present

## 2017-03-09 DIAGNOSIS — R410 Disorientation, unspecified: Secondary | ICD-10-CM | POA: Diagnosis not present

## 2017-03-09 DIAGNOSIS — R531 Weakness: Secondary | ICD-10-CM | POA: Diagnosis not present

## 2017-03-09 DIAGNOSIS — G309 Alzheimer's disease, unspecified: Secondary | ICD-10-CM | POA: Diagnosis not present

## 2017-03-09 DIAGNOSIS — Z79899 Other long term (current) drug therapy: Secondary | ICD-10-CM | POA: Diagnosis not present

## 2017-03-09 DIAGNOSIS — R2689 Other abnormalities of gait and mobility: Secondary | ICD-10-CM | POA: Diagnosis not present

## 2017-03-09 DIAGNOSIS — D649 Anemia, unspecified: Secondary | ICD-10-CM | POA: Diagnosis not present

## 2017-03-09 DIAGNOSIS — G9340 Encephalopathy, unspecified: Secondary | ICD-10-CM | POA: Diagnosis not present

## 2017-03-09 DIAGNOSIS — E039 Hypothyroidism, unspecified: Secondary | ICD-10-CM | POA: Diagnosis not present

## 2017-03-09 DIAGNOSIS — G9341 Metabolic encephalopathy: Secondary | ICD-10-CM | POA: Diagnosis not present

## 2017-03-09 DIAGNOSIS — M6281 Muscle weakness (generalized): Secondary | ICD-10-CM | POA: Diagnosis not present

## 2017-03-09 DIAGNOSIS — R630 Anorexia: Secondary | ICD-10-CM | POA: Diagnosis not present

## 2017-03-09 DIAGNOSIS — M1990 Unspecified osteoarthritis, unspecified site: Secondary | ICD-10-CM | POA: Diagnosis not present

## 2017-03-09 DIAGNOSIS — E440 Moderate protein-calorie malnutrition: Secondary | ICD-10-CM | POA: Diagnosis not present

## 2017-03-09 DIAGNOSIS — H109 Unspecified conjunctivitis: Secondary | ICD-10-CM | POA: Diagnosis not present

## 2017-03-09 DIAGNOSIS — J45909 Unspecified asthma, uncomplicated: Secondary | ICD-10-CM | POA: Diagnosis not present

## 2017-03-09 DIAGNOSIS — F5101 Primary insomnia: Secondary | ICD-10-CM | POA: Diagnosis not present

## 2017-03-09 DIAGNOSIS — E8809 Other disorders of plasma-protein metabolism, not elsewhere classified: Secondary | ICD-10-CM | POA: Diagnosis not present

## 2017-03-10 DIAGNOSIS — F0280 Dementia in other diseases classified elsewhere without behavioral disturbance: Secondary | ICD-10-CM | POA: Diagnosis not present

## 2017-03-10 DIAGNOSIS — E039 Hypothyroidism, unspecified: Secondary | ICD-10-CM | POA: Diagnosis not present

## 2017-03-10 DIAGNOSIS — E440 Moderate protein-calorie malnutrition: Secondary | ICD-10-CM | POA: Diagnosis not present

## 2017-03-10 DIAGNOSIS — R638 Other symptoms and signs concerning food and fluid intake: Secondary | ICD-10-CM | POA: Diagnosis not present

## 2017-03-10 DIAGNOSIS — G9341 Metabolic encephalopathy: Secondary | ICD-10-CM | POA: Diagnosis not present

## 2017-03-10 DIAGNOSIS — R400 Somnolence: Secondary | ICD-10-CM | POA: Diagnosis not present

## 2017-03-10 DIAGNOSIS — M6281 Muscle weakness (generalized): Secondary | ICD-10-CM | POA: Diagnosis not present

## 2017-03-10 DIAGNOSIS — G309 Alzheimer's disease, unspecified: Secondary | ICD-10-CM | POA: Diagnosis not present

## 2017-03-10 DIAGNOSIS — G4700 Insomnia, unspecified: Secondary | ICD-10-CM | POA: Diagnosis not present

## 2017-03-10 DIAGNOSIS — R5383 Other fatigue: Secondary | ICD-10-CM | POA: Diagnosis not present

## 2017-03-10 DIAGNOSIS — R262 Difficulty in walking, not elsewhere classified: Secondary | ICD-10-CM | POA: Diagnosis not present

## 2017-03-10 DIAGNOSIS — R740 Nonspecific elevation of levels of transaminase and lactic acid dehydrogenase [LDH]: Secondary | ICD-10-CM | POA: Diagnosis not present

## 2017-03-11 DIAGNOSIS — E039 Hypothyroidism, unspecified: Secondary | ICD-10-CM | POA: Diagnosis not present

## 2017-03-11 DIAGNOSIS — G309 Alzheimer's disease, unspecified: Secondary | ICD-10-CM | POA: Diagnosis not present

## 2017-03-11 DIAGNOSIS — R740 Nonspecific elevation of levels of transaminase and lactic acid dehydrogenase [LDH]: Secondary | ICD-10-CM | POA: Diagnosis not present

## 2017-03-11 DIAGNOSIS — R400 Somnolence: Secondary | ICD-10-CM | POA: Diagnosis not present

## 2017-03-11 DIAGNOSIS — M6281 Muscle weakness (generalized): Secondary | ICD-10-CM | POA: Diagnosis not present

## 2017-03-11 DIAGNOSIS — R638 Other symptoms and signs concerning food and fluid intake: Secondary | ICD-10-CM | POA: Diagnosis not present

## 2017-03-11 DIAGNOSIS — G9341 Metabolic encephalopathy: Secondary | ICD-10-CM | POA: Diagnosis not present

## 2017-03-11 DIAGNOSIS — F0280 Dementia in other diseases classified elsewhere without behavioral disturbance: Secondary | ICD-10-CM | POA: Diagnosis not present

## 2017-03-11 DIAGNOSIS — G4700 Insomnia, unspecified: Secondary | ICD-10-CM | POA: Diagnosis not present

## 2017-03-11 DIAGNOSIS — R5383 Other fatigue: Secondary | ICD-10-CM | POA: Diagnosis not present

## 2017-03-11 DIAGNOSIS — E440 Moderate protein-calorie malnutrition: Secondary | ICD-10-CM | POA: Diagnosis not present

## 2017-03-11 DIAGNOSIS — R262 Difficulty in walking, not elsewhere classified: Secondary | ICD-10-CM | POA: Diagnosis not present

## 2017-03-12 DIAGNOSIS — E440 Moderate protein-calorie malnutrition: Secondary | ICD-10-CM | POA: Diagnosis not present

## 2017-03-12 DIAGNOSIS — G4700 Insomnia, unspecified: Secondary | ICD-10-CM | POA: Diagnosis not present

## 2017-03-12 DIAGNOSIS — R638 Other symptoms and signs concerning food and fluid intake: Secondary | ICD-10-CM | POA: Diagnosis not present

## 2017-03-12 DIAGNOSIS — G309 Alzheimer's disease, unspecified: Secondary | ICD-10-CM | POA: Diagnosis not present

## 2017-03-12 DIAGNOSIS — F0280 Dementia in other diseases classified elsewhere without behavioral disturbance: Secondary | ICD-10-CM | POA: Diagnosis not present

## 2017-03-12 DIAGNOSIS — R740 Nonspecific elevation of levels of transaminase and lactic acid dehydrogenase [LDH]: Secondary | ICD-10-CM | POA: Diagnosis not present

## 2017-03-12 DIAGNOSIS — R262 Difficulty in walking, not elsewhere classified: Secondary | ICD-10-CM | POA: Diagnosis not present

## 2017-03-12 DIAGNOSIS — E039 Hypothyroidism, unspecified: Secondary | ICD-10-CM | POA: Diagnosis not present

## 2017-03-12 DIAGNOSIS — R400 Somnolence: Secondary | ICD-10-CM | POA: Diagnosis not present

## 2017-03-12 DIAGNOSIS — R5383 Other fatigue: Secondary | ICD-10-CM | POA: Diagnosis not present

## 2017-03-12 DIAGNOSIS — M6281 Muscle weakness (generalized): Secondary | ICD-10-CM | POA: Diagnosis not present

## 2017-03-12 DIAGNOSIS — G9341 Metabolic encephalopathy: Secondary | ICD-10-CM | POA: Diagnosis not present

## 2017-03-13 DIAGNOSIS — G9341 Metabolic encephalopathy: Secondary | ICD-10-CM | POA: Diagnosis not present

## 2017-03-13 DIAGNOSIS — F0280 Dementia in other diseases classified elsewhere without behavioral disturbance: Secondary | ICD-10-CM | POA: Diagnosis not present

## 2017-03-13 DIAGNOSIS — E440 Moderate protein-calorie malnutrition: Secondary | ICD-10-CM | POA: Diagnosis not present

## 2017-03-13 DIAGNOSIS — G4700 Insomnia, unspecified: Secondary | ICD-10-CM | POA: Diagnosis not present

## 2017-03-13 DIAGNOSIS — R5383 Other fatigue: Secondary | ICD-10-CM | POA: Diagnosis not present

## 2017-03-13 DIAGNOSIS — R262 Difficulty in walking, not elsewhere classified: Secondary | ICD-10-CM | POA: Diagnosis not present

## 2017-03-13 DIAGNOSIS — R638 Other symptoms and signs concerning food and fluid intake: Secondary | ICD-10-CM | POA: Diagnosis not present

## 2017-03-13 DIAGNOSIS — M6281 Muscle weakness (generalized): Secondary | ICD-10-CM | POA: Diagnosis not present

## 2017-03-13 DIAGNOSIS — R740 Nonspecific elevation of levels of transaminase and lactic acid dehydrogenase [LDH]: Secondary | ICD-10-CM | POA: Diagnosis not present

## 2017-03-13 DIAGNOSIS — E039 Hypothyroidism, unspecified: Secondary | ICD-10-CM | POA: Diagnosis not present

## 2017-03-13 DIAGNOSIS — R400 Somnolence: Secondary | ICD-10-CM | POA: Diagnosis not present

## 2017-03-13 DIAGNOSIS — G309 Alzheimer's disease, unspecified: Secondary | ICD-10-CM | POA: Diagnosis not present

## 2017-03-14 DIAGNOSIS — R740 Nonspecific elevation of levels of transaminase and lactic acid dehydrogenase [LDH]: Secondary | ICD-10-CM | POA: Diagnosis not present

## 2017-03-14 DIAGNOSIS — F0280 Dementia in other diseases classified elsewhere without behavioral disturbance: Secondary | ICD-10-CM | POA: Diagnosis not present

## 2017-03-14 DIAGNOSIS — G309 Alzheimer's disease, unspecified: Secondary | ICD-10-CM | POA: Diagnosis not present

## 2017-03-14 DIAGNOSIS — E039 Hypothyroidism, unspecified: Secondary | ICD-10-CM | POA: Diagnosis not present

## 2017-03-14 DIAGNOSIS — R630 Anorexia: Secondary | ICD-10-CM | POA: Diagnosis not present

## 2017-03-14 DIAGNOSIS — T887XXA Unspecified adverse effect of drug or medicament, initial encounter: Secondary | ICD-10-CM | POA: Diagnosis not present

## 2017-03-14 DIAGNOSIS — E440 Moderate protein-calorie malnutrition: Secondary | ICD-10-CM | POA: Diagnosis not present

## 2017-03-14 DIAGNOSIS — R269 Unspecified abnormalities of gait and mobility: Secondary | ICD-10-CM | POA: Diagnosis not present

## 2017-03-14 DIAGNOSIS — G9340 Encephalopathy, unspecified: Secondary | ICD-10-CM | POA: Diagnosis not present

## 2017-03-14 DIAGNOSIS — G4700 Insomnia, unspecified: Secondary | ICD-10-CM | POA: Diagnosis not present

## 2017-03-27 DIAGNOSIS — R451 Restlessness and agitation: Secondary | ICD-10-CM | POA: Diagnosis not present

## 2017-03-27 DIAGNOSIS — F319 Bipolar disorder, unspecified: Secondary | ICD-10-CM | POA: Diagnosis not present

## 2017-03-27 DIAGNOSIS — Z09 Encounter for follow-up examination after completed treatment for conditions other than malignant neoplasm: Secondary | ICD-10-CM | POA: Diagnosis not present

## 2017-03-27 DIAGNOSIS — J418 Mixed simple and mucopurulent chronic bronchitis: Secondary | ICD-10-CM | POA: Diagnosis not present

## 2017-03-27 DIAGNOSIS — G9341 Metabolic encephalopathy: Secondary | ICD-10-CM | POA: Diagnosis not present

## 2017-04-10 DIAGNOSIS — E039 Hypothyroidism, unspecified: Secondary | ICD-10-CM | POA: Diagnosis not present

## 2017-04-10 DIAGNOSIS — F5101 Primary insomnia: Secondary | ICD-10-CM | POA: Diagnosis not present

## 2017-04-10 DIAGNOSIS — G300 Alzheimer's disease with early onset: Secondary | ICD-10-CM | POA: Diagnosis not present

## 2017-04-10 DIAGNOSIS — F310 Bipolar disorder, current episode hypomanic: Secondary | ICD-10-CM | POA: Diagnosis not present

## 2017-04-10 DIAGNOSIS — F0280 Dementia in other diseases classified elsewhere without behavioral disturbance: Secondary | ICD-10-CM | POA: Diagnosis not present

## 2017-04-10 DIAGNOSIS — J4520 Mild intermittent asthma, uncomplicated: Secondary | ICD-10-CM | POA: Diagnosis not present

## 2017-05-02 DIAGNOSIS — F0390 Unspecified dementia without behavioral disturbance: Secondary | ICD-10-CM | POA: Diagnosis not present

## 2020-03-26 DEATH — deceased
